# Patient Record
Sex: Male | Born: 1995 | Race: White | Hispanic: No | Marital: Single | Smoking: Never smoker
Health system: Southern US, Community
[De-identification: ages and names within clinical notes are randomized; demographics above are authoritative.]

## PROBLEM LIST (undated history)

## (undated) DIAGNOSIS — F32A Depression, unspecified: Secondary | ICD-10-CM

## (undated) DIAGNOSIS — F902 Attention-deficit hyperactivity disorder, combined type: Secondary | ICD-10-CM

## (undated) DIAGNOSIS — S060X0A Concussion without loss of consciousness, initial encounter: Secondary | ICD-10-CM

## (undated) DIAGNOSIS — F329 Major depressive disorder, single episode, unspecified: Secondary | ICD-10-CM

## (undated) DIAGNOSIS — F419 Anxiety disorder, unspecified: Secondary | ICD-10-CM

## (undated) HISTORY — DX: Major depressive disorder, single episode, unspecified: F32.9

## (undated) HISTORY — DX: Anxiety disorder, unspecified: F41.9

## (undated) HISTORY — DX: Depression, unspecified: F32.A

## (undated) HISTORY — DX: Attention-deficit hyperactivity disorder, combined type: F90.2

## (undated) HISTORY — DX: Concussion without loss of consciousness, initial encounter: S06.0X0A

---
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
## 2017-01-10 DIAGNOSIS — S060X0A Concussion without loss of consciousness, initial encounter: Secondary | ICD-10-CM

## 2017-01-10 HISTORY — DX: Concussion without loss of consciousness, initial encounter: S06.0X0A

## 2017-07-24 ENCOUNTER — Ambulatory Visit (INDEPENDENT_AMBULATORY_CARE_PROVIDER_SITE_OTHER): Payer: BLUE CROSS/BLUE SHIELD | Admitting: Physician Assistant

## 2017-07-24 ENCOUNTER — Ambulatory Visit (INDEPENDENT_AMBULATORY_CARE_PROVIDER_SITE_OTHER): Payer: BLUE CROSS/BLUE SHIELD

## 2017-07-24 ENCOUNTER — Encounter: Payer: Self-pay | Admitting: Physician Assistant

## 2017-07-24 VITALS — BP 114/69 | HR 64 | Temp 98.2°F | Ht 71.0 in | Wt 159.0 lb

## 2017-07-24 DIAGNOSIS — F902 Attention-deficit hyperactivity disorder, combined type: Secondary | ICD-10-CM | POA: Diagnosis not present

## 2017-07-24 DIAGNOSIS — Z7689 Persons encountering health services in other specified circumstances: Secondary | ICD-10-CM

## 2017-07-24 DIAGNOSIS — R052 Subacute cough: Secondary | ICD-10-CM

## 2017-07-24 DIAGNOSIS — F32A Depression, unspecified: Secondary | ICD-10-CM

## 2017-07-24 DIAGNOSIS — R05 Cough: Secondary | ICD-10-CM

## 2017-07-24 DIAGNOSIS — Z113 Encounter for screening for infections with a predominantly sexual mode of transmission: Secondary | ICD-10-CM | POA: Diagnosis not present

## 2017-07-24 DIAGNOSIS — Z23 Encounter for immunization: Secondary | ICD-10-CM | POA: Insufficient documentation

## 2017-07-24 DIAGNOSIS — F324 Major depressive disorder, single episode, in partial remission: Secondary | ICD-10-CM | POA: Diagnosis not present

## 2017-07-24 DIAGNOSIS — F329 Major depressive disorder, single episode, unspecified: Secondary | ICD-10-CM | POA: Diagnosis not present

## 2017-07-24 DIAGNOSIS — F419 Anxiety disorder, unspecified: Secondary | ICD-10-CM | POA: Diagnosis not present

## 2017-07-24 DIAGNOSIS — Z025 Encounter for examination for participation in sport: Secondary | ICD-10-CM

## 2017-07-24 DIAGNOSIS — R0989 Other specified symptoms and signs involving the circulatory and respiratory systems: Secondary | ICD-10-CM | POA: Diagnosis not present

## 2017-07-24 DIAGNOSIS — Z87820 Personal history of traumatic brain injury: Secondary | ICD-10-CM | POA: Diagnosis not present

## 2017-07-24 MED ORDER — AZITHROMYCIN 250 MG PO TABS: ORAL_TABLET | ORAL | 0 refills | Status: DC

## 2017-07-24 MED ORDER — PREDNISONE 50 MG PO TABS: ORAL_TABLET | ORAL | 0 refills | Status: DC

## 2017-07-24 NOTE — Patient Instructions (Addendum)
- Plan to go downstairs for Chest X-ray and labs for screen - Azithromycin and Prednisone for 5 days as directed  - Return if symptoms worsen or fail to improve  - Return in 2 months for 2nd HPV vaccine - Return in 6 months for 3rd HPV vaccine   Preventive Care 18-39 Years, Male Preventive care refers to lifestyle choices and visits with your health care provider that can promote health and wellness. What does preventive care include?  A yearly physical exam. This is also called an annual well check.  Dental exams once or twice a year.  Routine eye exams. Ask your health care provider how often you should have your eyes checked.  Personal lifestyle choices, including: ? Daily care of your teeth and gums. ? Regular physical activity. ? Eating a healthy diet. ? Avoiding tobacco and drug use. ? Limiting alcohol use. ? Practicing safe sex. What happens during an annual well check? The services and screenings done by your health care provider during your annual well check will depend on your age, overall health, lifestyle risk factors, and family history of disease. Counseling Your health care provider may ask you questions about your:  Alcohol use.  Tobacco use.  Drug use.  Emotional well-being.  Home and relationship well-being.  Sexual activity.  Eating habits.  Work and work Statistician.  Screening You may have the following tests or measurements:  Height, weight, and BMI.  Blood pressure.  Lipid and cholesterol levels. These may be checked every 5 years starting at age 23.  Diabetes screening. This is done by checking your blood sugar (glucose) after you have not eaten for a while (fasting).  Skin check.  Hepatitis C blood test.  Hepatitis B blood test.  Sexually transmitted disease (STD) testing.  Discuss your test results, treatment options, and if necessary, the need for more tests with your health care provider. Vaccines Your health care provider  may recommend certain vaccines, such as:  Influenza vaccine. This is recommended every year.  Tetanus, diphtheria, and acellular pertussis (Tdap, Td) vaccine. You may need a Td booster every 10 years.  Varicella vaccine. You may need this if you have not been vaccinated.  HPV vaccine. If you are 3 or younger, you may need three doses over 6 months.  Measles, mumps, and rubella (MMR) vaccine. You may need at least one dose of MMR.You may also need a second dose.  Pneumococcal 13-valent conjugate (PCV13) vaccine. You may need this if you have certain conditions and have not been vaccinated.  Pneumococcal polysaccharide (PPSV23) vaccine. You may need one or two doses if you smoke cigarettes or if you have certain conditions.  Meningococcal vaccine. One dose is recommended if you are age 20-21 years and a first-year college student living in a residence hall, or if you have one of several medical conditions. You may also need additional booster doses.  Hepatitis A vaccine. You may need this if you have certain conditions or if you travel or work in places where you may be exposed to hepatitis A.  Hepatitis B vaccine. You may need this if you have certain conditions or if you travel or work in places where you may be exposed to hepatitis B.  Haemophilus influenzae type b (Hib) vaccine. You may need this if you have certain risk factors.  Talk to your health care provider about which screenings and vaccines you need and how often you need them. This information is not intended to replace advice given  to you by your health care provider. Make sure you discuss any questions you have with your health care provider. Document Released: 01/22/2002 Document Revised: 08/15/2016 Document Reviewed: 09/27/2015 Elsevier Interactive Patient Education  2017 [employer].

## 2017-07-24 NOTE — Progress Notes (Signed)
Normal chest x-ray Complete antibiotic and steroid as prescribed

## 2017-07-24 NOTE — Progress Notes (Signed)
Subjective:     Jimmy Benjamin is a 21 y.o. male who presents for a school sports physical exam. Patient/parent deny any current health related concerns.  He plans to participate in Reserve at [employer]  Patient reports history of 3 concussions; 2 in high school and most recent in February 2018. All concussions were without loss of consciousness. Most recent concussion took 4 weeks to recover and patient missed 1 month of college.  Depression/Anxiety: taking Fluoxetine without difficulty. Feels medication is working well. Denies adverse effects. Endorses some anhedonia and depressed mood on some days. Reports restful sleep. Denies symptoms of mania/hypomania. Denies suicidal thinking. Denies auditory/visual hallucinations.   Immunization History  Administered Date(s) Administered  . Meningococcal Conjugate 01/11/2011  . Td 06/15/2009    The following portions of the patient's history were reviewed and updated as appropriate: allergies, current medications, past family history, past medical history, past social history, past surgical history and problem list.  Past Medical History:  Diagnosis Date  . ADHD (attention deficit hyperactivity disorder), combined type   . Anxiety   . Concussion without loss of consciousness 01/2017  . Depression    History reviewed. No pertinent surgical history.  Family History  Problem Relation Age of Onset  . Hyperlipidemia Maternal Grandfather   . Lung cancer Maternal Grandfather   . Brain cancer Paternal Grandmother   . Depression Mother   . Lymphoma Sister    Review of Systems Constitutional: negative Eyes: negative Ears, nose, mouth, throat, and face: negative Respiratory: negative except for cough. Cardiovascular: negative Gastrointestinal: negative Genitourinary:negative Hematologic/lymphatic: negative Musculoskeletal:negative Neurological: negative Behavioral/Psych: negative except for ADHD and  depression. Allergic/Immunologic: negative    Objective:   Vitals:   07/24/17 1424  BP: 114/69  Pulse: 64  Temp: 98.2 F (36.8 C)  SpO2: 98%    General Appearance:  Alert, cooperative, no distress, appropriate for age                            Head:  Normocephalic, without obvious abnormality                             Eyes:  PERRL, EOM's intact, conjunctiva and cornea clear,                              Ears:  TM pearly gray color and semitransparent, external ear canals normal, both ears                            Nose:  Nares symmetrical                          Throat:  Lips, tongue, and mucosa are moist, pink, and intact; good dentition                             Neck:  Supple; symmetrical, trachea midline, no adenopathy; thyroid: no enlargement, symmetric, no tenderness/mass/nodules                             Back:  Symmetrical, no curvature, ROM normal  Lungs:  respirations unlabored, diffuse rhonchi in the lower lobes bilaterally                            Heart:  regular rate & normal rhythm, S1 and S2 normal, no murmurs, rubs, or gallops                     Abdomen:  Soft, non-tender, no mass or organomegaly              Genitourinary:  deferred         Musculoskeletal:  Tone and strength strong and symmetrical, all extremities; no joint pain or edema, normal duck walk                                       Lymphatic:  No adenopathy             Skin/Hair/Nails:  Skin warm, dry and intact, no rashes or abnormal dyspigmentation                   Neurologic:  Alert and oriented x3, no cranial nerve deficits, DTR's intact, sensation grossly intact, normal gait and station, no tremor Psych: well-groomed, cooperative, good eye contact, depressed mood, affect mood-congruent, speech fluent, organized thought processes  Depression screen [facility] 2/9 07/24/2017  Decreased Interest 2  Down, Depressed, Hopeless 1  PHQ - 2 Score 3  Altered sleeping 0  Tired,  decreased energy 0  Change in appetite 0  Feeling bad or failure about yourself  0  Trouble concentrating 1  Moving slowly or fidgety/restless 0  Suicidal thoughts 0  PHQ-9 Score 4     Assessment:    Satisfactory school sports physical exam.    History of multiple concussions. Evaluated by Sports Medicine, Dr. Clementeen Graham today. Major depressive disorder in partial remission ADHD, combined type Subacute cough Routine STI screening   Plan:    Permission granted to participate in athletics without restrictions. Form signed and returned to patient. Immunizations reviewed and updated per orders below  1. Encounter to establish care  2. Encounter for sports participation examination  3. Attention deficit hyperactivity disorder (ADHD), combined type - cont Mydayis 25mg  daily  4. History of multiple concussions - eval by Sports Medicine - explained patient is more susceptible to concussion given history of 3 closed head injuries - discussed that repeat concussion may warrant avoidance of contact sports going forward  5. Rhonchi - DG Chest 2 View  6. Subacute cough - DG Chest 2 View - azithromycin (ZITHROMAX Z-PAK) 250 MG tablet; Take 2 tablets (500 mg) on  Day 1,  followed by 1 tablet (250 mg) once daily on Days 2 through 5.  Dispense: 6 tablet; Refill: 0 - predniSONE (DELTASONE) 50 MG tablet; One tab PO daily for 5 days.  Dispense: 5 tablet; Refill: 0  7. Routine screening for STI (sexually transmitted infection) - GC/Chlamydia Probe Amp - HIV antibody - RPR - Hepatitis C antibody  8. Need for HPV vaccination - HPV 9-valent vaccine,Recombinat  9. Need for meningococcal vaccination - MENINGOCOCCAL MCV4O(MENVEO)  10. Major depressive disorder with single episode, in partial remission (HCC) - PHQ9 score 4, mild. Continue Prozac daily - FLUoxetine (PROZAC) 40 MG capsule; Take 1 capsule by mouth daily.; Refill: 3  11. Anxiety and depression  Patient education and  anticipatory  guidance given Patient agrees with treatment plan Follow-up in 2 months for 2nd HPV injection or sooner as needed  Levonne Hubertharley E. Jshon Ibe PA-C

## 2017-07-25 LAB — HIV ANTIBODY (ROUTINE TESTING W REFLEX): HIV: NONREACTIVE

## 2017-07-25 LAB — GC/CHLAMYDIA PROBE AMP
CT Probe RNA: NOT DETECTED
GC PROBE AMP APTIMA: NOT DETECTED

## 2017-07-25 LAB — HEPATITIS C ANTIBODY: HCV Ab: NONREACTIVE

## 2017-07-25 LAB — RPR

## 2017-07-25 NOTE — Progress Notes (Signed)
STI testing negative

## 2017-07-29 ENCOUNTER — Encounter: Payer: Self-pay | Admitting: Physician Assistant

## 2017-07-29 DIAGNOSIS — F32A Depression, unspecified: Secondary | ICD-10-CM | POA: Insufficient documentation

## 2017-07-29 DIAGNOSIS — F329 Major depressive disorder, single episode, unspecified: Secondary | ICD-10-CM | POA: Insufficient documentation

## 2017-07-29 DIAGNOSIS — F419 Anxiety disorder, unspecified: Secondary | ICD-10-CM

## 2017-07-29 DIAGNOSIS — F324 Major depressive disorder, single episode, in partial remission: Secondary | ICD-10-CM | POA: Insufficient documentation

## 2017-07-31 ENCOUNTER — Encounter: Payer: Self-pay | Admitting: Family Medicine

## 2017-07-31 NOTE — Progress Notes (Signed)
Jimmy Benjamin seen briefly on August 15 with Carlis Stable, PA-C as part of a preparticipation physical exam for college lacrosse. He has a history of concussions but is currently asymptomatic. We had a lengthy discussion about what to look out for for concussion. Patient will return as needed. I think is perfectly fine for him to play lacrosse this year.

## 2017-09-23 ENCOUNTER — Ambulatory Visit: Payer: BLUE CROSS/BLUE SHIELD

## 2018-01-22 ENCOUNTER — Ambulatory Visit: Payer: BLUE CROSS/BLUE SHIELD

## 2018-08-11 ENCOUNTER — Other Ambulatory Visit: Payer: Self-pay

## 2018-08-11 ENCOUNTER — Emergency Department (HOSPITAL_COMMUNITY)
Admission: EM | Admit: 2018-08-11 | Discharge: 2018-08-11 | Disposition: A | Discharge status: Home / Self Care | Payer: BLUE CROSS/BLUE SHIELD | Attending: Emergency Medicine | Admitting: Emergency Medicine

## 2018-08-11 ENCOUNTER — Encounter (HOSPITAL_COMMUNITY): Payer: Self-pay | Admitting: Emergency Medicine

## 2018-08-11 DIAGNOSIS — B370 Candidal stomatitis: Secondary | ICD-10-CM

## 2018-08-11 DIAGNOSIS — Z79899 Other long term (current) drug therapy: Secondary | ICD-10-CM | POA: Diagnosis not present

## 2018-08-11 DIAGNOSIS — K0889 Other specified disorders of teeth and supporting structures: Secondary | ICD-10-CM | POA: Diagnosis present

## 2018-08-11 DIAGNOSIS — K121 Other forms of stomatitis: Secondary | ICD-10-CM | POA: Diagnosis not present

## 2018-08-11 LAB — CBC WITH DIFFERENTIAL/PLATELET
Abs Immature Granulocytes: 0 10*3/uL (ref 0.0–0.1)
BASOS PCT: 0 %
Basophils Absolute: 0 10*3/uL (ref 0.0–0.1)
EOS ABS: 0 10*3/uL (ref 0.0–0.7)
EOS PCT: 1 %
HCT: 45.7 % (ref 39.0–52.0)
Hemoglobin: 15.7 g/dL (ref 13.0–17.0)
IMMATURE GRANULOCYTES: 0 %
Lymphocytes Relative: 24 %
Lymphs Abs: 1.5 10*3/uL (ref 0.7–4.0)
MCH: 31.5 pg (ref 26.0–34.0)
MCHC: 34.4 g/dL (ref 30.0–36.0)
MCV: 91.6 fL (ref 78.0–100.0)
MONOS PCT: 11 %
Monocytes Absolute: 0.7 10*3/uL (ref 0.1–1.0)
NEUTROS PCT: 64 %
Neutro Abs: 4.2 10*3/uL (ref 1.7–7.7)
PLATELETS: 268 10*3/uL (ref 150–400)
RBC: 4.99 MIL/uL (ref 4.22–5.81)
RDW: 11.4 % — AB (ref 11.5–15.5)
WBC: 6.5 10*3/uL (ref 4.0–10.5)

## 2018-08-11 LAB — BASIC METABOLIC PANEL
ANION GAP: 12 (ref 5–15)
BUN: 11 mg/dL (ref 6–20)
CALCIUM: 9.1 mg/dL (ref 8.9–10.3)
CO2: 29 mmol/L (ref 22–32)
CREATININE: 1.01 mg/dL (ref 0.61–1.24)
Chloride: 96 mmol/L — ABNORMAL LOW (ref 98–111)
GFR calc Af Amer: >60 mL/min (ref >60)
GLUCOSE: 91 mg/dL (ref 70–99)
Potassium: 3.9 mmol/L (ref 3.5–5.1)
Sodium: 137 mmol/L (ref 135–145)

## 2018-08-11 MED ORDER — LIDOCAINE VISCOUS HCL 2 % MT SOLN
15.0000 mL | Freq: Once | OROMUCOSAL | Status: AC
  Administered 2018-08-11: 15 mL via OROMUCOSAL
  Filled 2018-08-11: qty 15

## 2018-08-11 MED ORDER — MAGIC MOUTHWASH W/LIDOCAINE: 10.0000 mL | Freq: Four times a day (QID) | ORAL | 0 refills | Status: DC

## 2018-08-11 NOTE — ED Provider Notes (Signed)
[facility] EMERGENCY DEPARTMENT Provider Note   CSN: [id number] Arrival date & time: 08/11/18  2146     History   Chief Complaint Chief Complaint  Patient presents with  . Oral Thrush    HPI Jimmy Benjamin is a 22 y.o. male.  Who presents the emergency department chief complaint of mouth infection.  The patient was seen at an urgent care 2 days ago and told that he might have Stevens-Johnson syndrome.  Had his wisdom teeth removed and is was on prophylactic penicillin VK 4 times daily but finished his course about 2 weeks ago.  This past Thursday, 4 days ago the patient states that he felt like he was getting sick with sore throat, body aches chills and ran a fever.  He took DayQuil and NyQuil and started taking his penicillin again.  Did ask that he awoke with an aphthous ulcer on his palate and tongue which progressed to his gingiva and cheeks and throat.  He continue taking the penicillin and began developing a thick white coat over his tongue.  He complains of severe pain in his mouth and gums.  He was not given any medications at discharge from the urgent care.  He denies fevers or chills.  He has had difficulty eating and drinking secondary to the pain as well.  He is no longer running fevers.  HPI  Past Medical History:  Diagnosis Date  . ADHD (attention deficit hyperactivity disorder), combined type   . Anxiety   . Concussion without loss of consciousness 01/2017   x 3   . Depression     Patient Active Problem List   Diagnosis Date Noted  . Major depressive disorder with single episode, in partial remission (HCC) 07/29/2017  . Anxiety and depression 07/29/2017  . Subacute cough 07/24/2017  . Rhonchi 07/24/2017  . History of multiple concussions 07/24/2017  . Attention deficit hyperactivity disorder (ADHD), combined type 07/24/2017  . Need for HPV vaccination 07/24/2017  . Atypical chest pain 10/14/2016  . Right wrist injury 10/26/2014    History  reviewed. No pertinent surgical history.      Home Medications    Prior to Admission medications   Medication Sig Start Date End Date Taking? Authorizing Provider  azithromycin (ZITHROMAX Z-PAK) 250 MG tablet Take 2 tablets (500 mg) on  Day 1,  followed by 1 tablet (250 mg) once daily on Days 2 through 5. 07/24/17   Carlis Stable, PA-C  FLUoxetine (PROZAC) 40 MG capsule Take 1 capsule by mouth daily. 06/09/17   [provider]  MYDAYIS 25 MG CP24 Take 1 capsule by mouth daily. 06/29/17   [provider]  predniSONE (DELTASONE) 50 MG tablet One tab PO daily for 5 days. 07/24/17   Carlis Stable, PA-C    Family History Family History  Problem Relation Age of Onset  . Hyperlipidemia Maternal Grandfather   . Lung cancer Maternal Grandfather   . Brain cancer Paternal Grandmother   . Depression Mother   . Lymphoma Sister     Social History Social History   Tobacco Use  . Smoking status: Never Smoker  . Smokeless tobacco: Never Used  Substance Use Topics  . Alcohol use: Yes    Alcohol/week: 0.0 standard drinks    Comment: socially at college  . Drug use: No     Allergies   Patient has no known allergies.   Review of Systems Review of Systems  Ten systems reviewed and are negative for  acute change, except as noted in the HPI.   Physical Exam Updated Vital Signs Pulse 77   Temp 98.9 F (37.2 C) (Axillary)   Resp 16   Ht 5\' 11"  (1.803 m)   Wt 68.9 kg   SpO2 100%   BMI 21.20 kg/m   Physical Exam  Constitutional: He appears well-developed and well-nourished. No distress.  HENT:  Head: Normocephalic and atraumatic.  Mouth/Throat: Oral lesions present. Oropharyngeal exudate present.    Eyes: Conjunctivae are normal. No scleral icterus.  Neck: Normal range of motion. Neck supple.  Cardiovascular: Normal rate, regular rhythm and normal heart sounds.  Pulmonary/Chest: Effort normal and breath sounds normal. No respiratory  distress.  Abdominal: Soft. There is no tenderness.  Musculoskeletal: He exhibits no edema.  Neurological: He is alert.  Skin: Skin is warm and dry. He is not diaphoretic.  Psychiatric: His behavior is normal.  Nursing note and vitals reviewed.    ED Treatments / Results  Labs (all labs ordered are listed, but only abnormal results are displayed) Labs Reviewed  CBC WITH DIFFERENTIAL/PLATELET - Abnormal; Notable for the following components:      Result Value   RDW 11.4 (*)    All other components within normal limits  BASIC METABOLIC PANEL    EKG None  Radiology No results found.  Procedures Procedures (including critical care time)  Medications Ordered in ED Medications - No data to display   Initial Impression / Assessment and Plan / ED Course  I have reviewed the triage vital signs and the nursing notes.  Pertinent labs & imaging results that were available during my care of the patient were reviewed by me and considered in my medical decision making (see chart for details).     Patient with oral and gingival stomatitis.  I believe he presents a mixed picture of likely viral stomatitis and a secondary fungal thrush infection after continuing penicillin.  Patient was given viscous lidocaine here in the emergency department with immediate and total relief of his mouth pain she was quite grateful for.  The patient will be discharged with allergic mouthwash with nystatin and lidocaine.  I discussed the case with Dr. Gwenlyn Fudge who agrees with assessment and plan.  He is advised to follow-up with his primary care very closely in the next 2 days for recheck.  The patient does not have any involvement of any of the other mucosal surfaces are generalized he also has no surface rashes suggestive of other autoimmune process like Behcet's syndrome or Stevens-Johnson syndrome.  Final Clinical Impressions(s) / ED Diagnoses   Final diagnoses:  None    ED Discharge Orders    None        Arthor Captain, PA-C 08/12/18 0112    Pricilla Loveless, MD 08/15/18 2234

## 2018-08-11 NOTE — ED Notes (Signed)
ED Provider at bedside. 

## 2018-08-11 NOTE — ED Triage Notes (Signed)
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

## 2018-08-11 NOTE — Discharge Instructions (Signed)
Contact a health care provider if: Your symptoms get worse. You develop new symptoms, especially: A rash. New symptoms that do not involve your mouth area. Your symptoms last longer than three weeks. Your stomatitis goes away and then returns. You have a harder time eating and drinking normally. You have increasing fatigue or weakness. You lose your appetite or you feel nauseous. You have a fever.

## 2019-02-20 IMAGING — DX DG CHEST 2V
2 series · 2 of 2 positions shown · non-contrast
Comparison: None.

CLINICAL DATA: Cough for 2 months.

EXAM:
CHEST  2 VIEW

[chest pa]
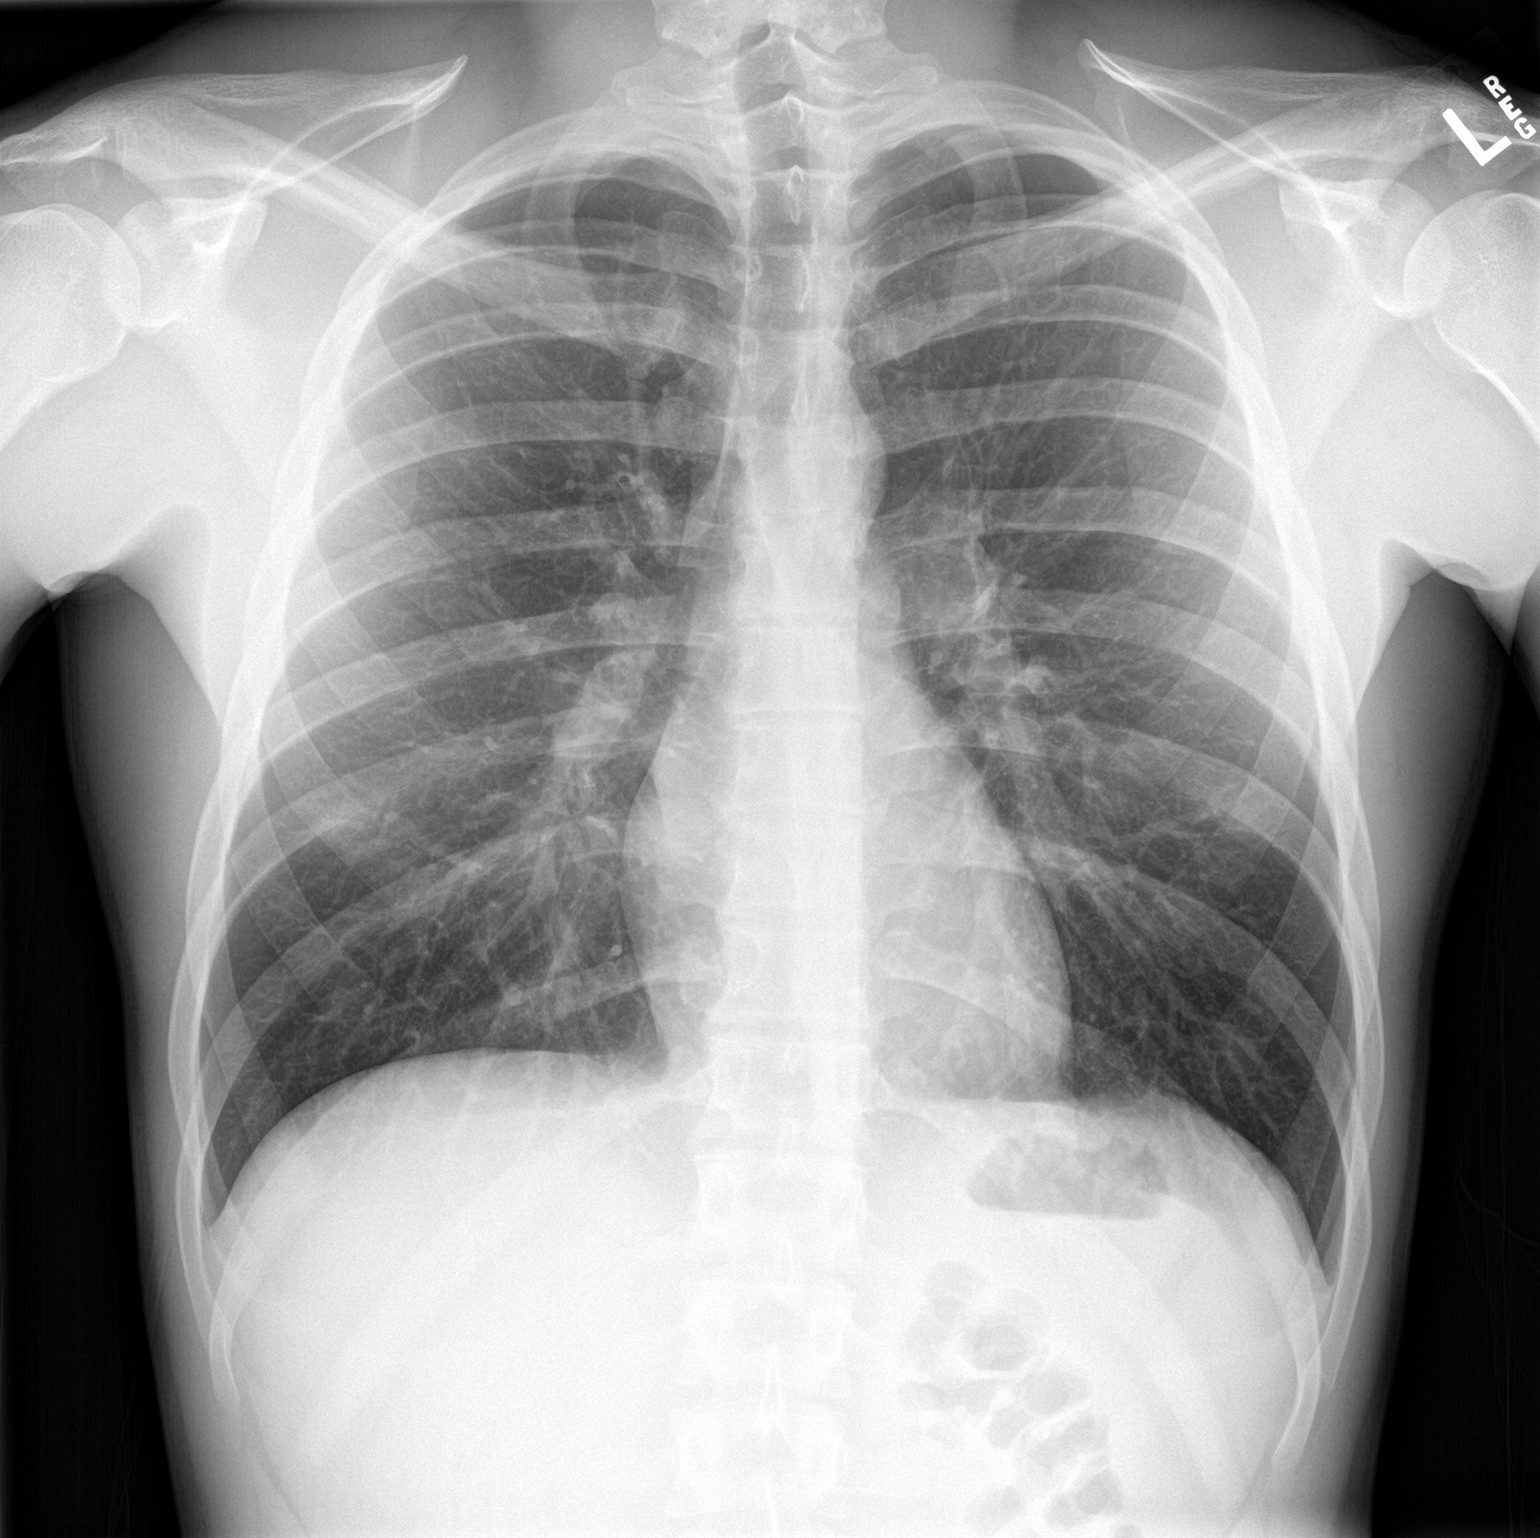

[chest lat]
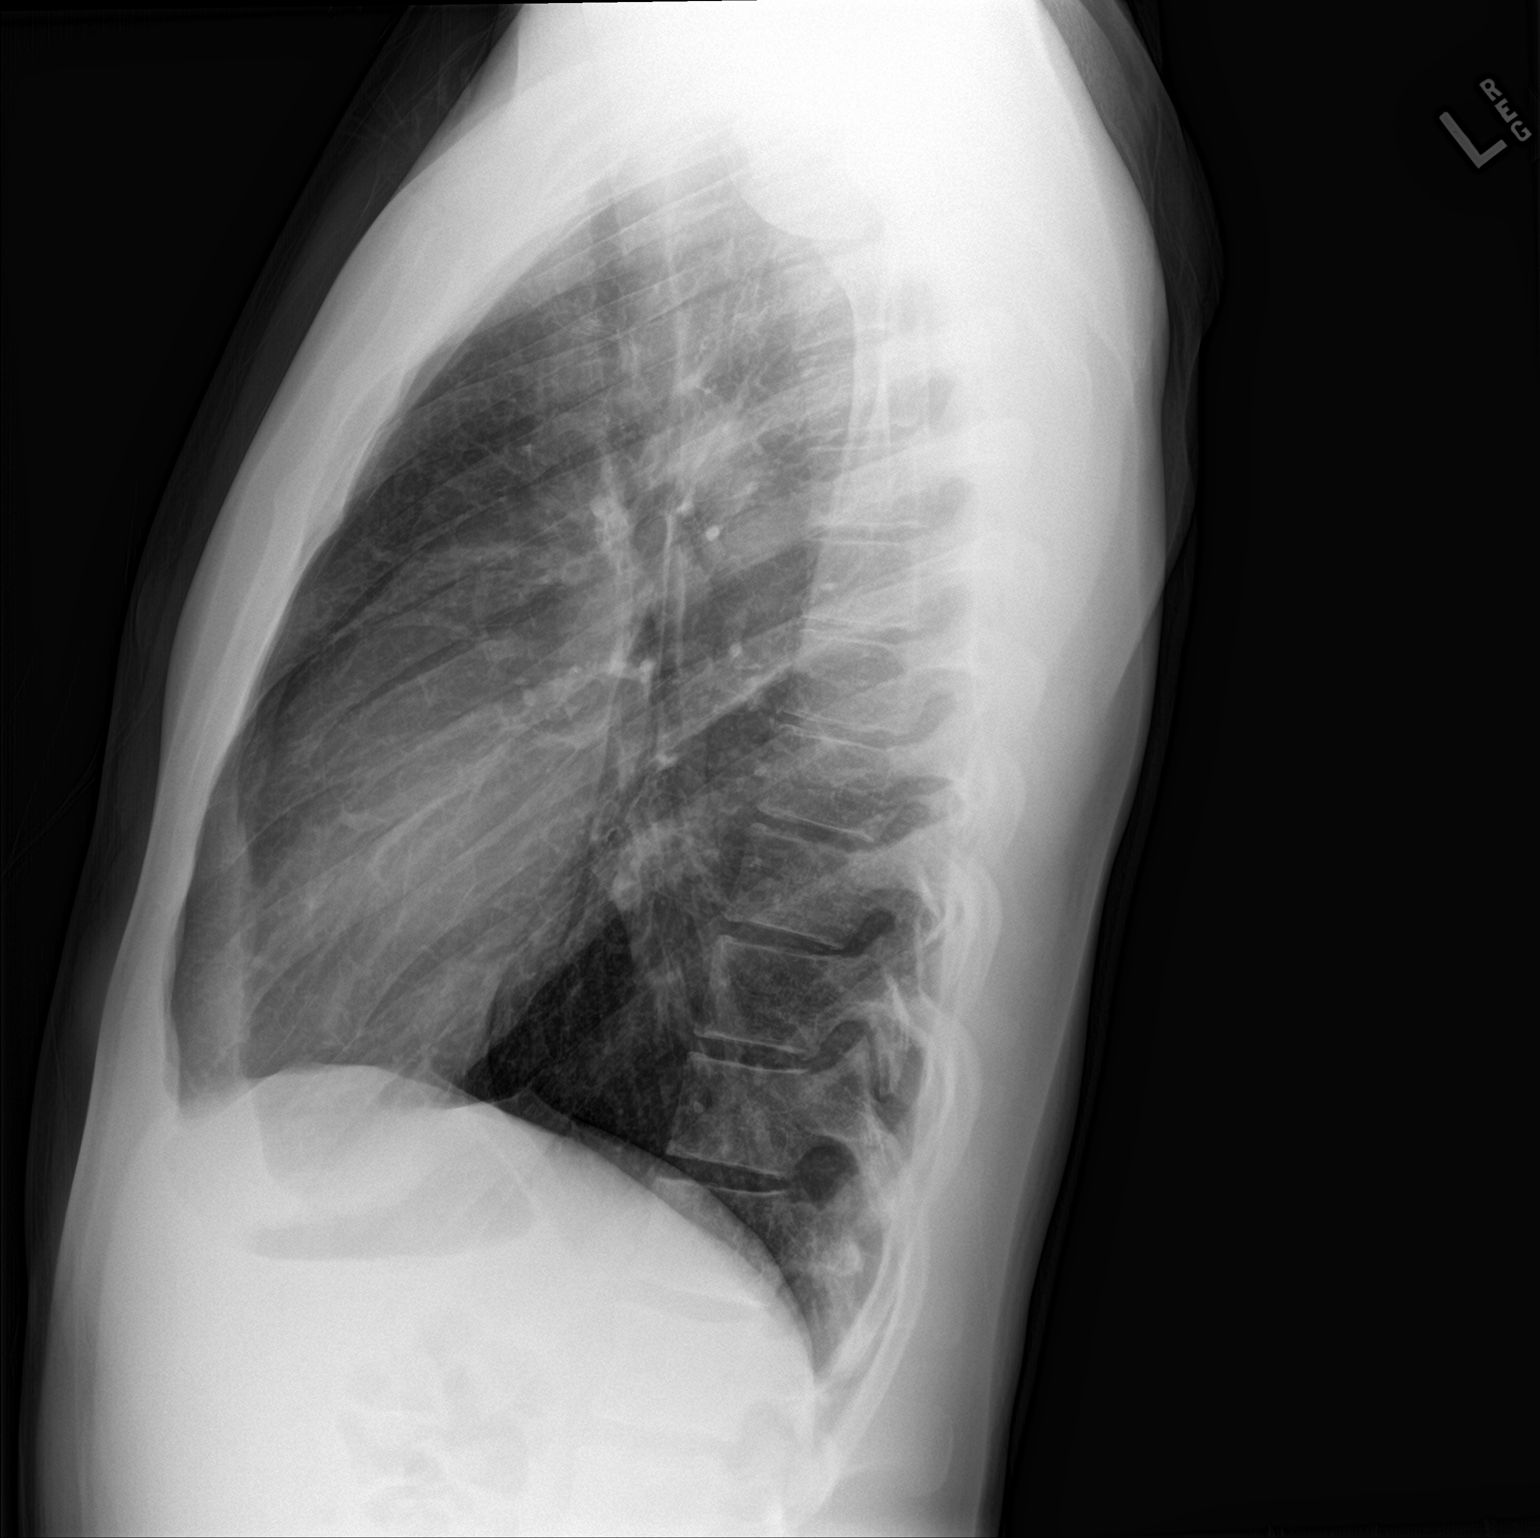

[2 of 2 positions shown; findings below may reference images not displayed]

FINDINGS: The lungs are clear. Heart size is normal. No pneumothorax or
pleural fluid. No bony abnormality.
IMPRESSION: Normal chest.

## 2021-08-24 ENCOUNTER — Telehealth: Payer: Self-pay

## 2021-08-24 NOTE — Telephone Encounter (Signed)
Pt called and said that his dad is a patient of Dr Beverely Low and you agreed to take his son on as a patient? The dads name is Jimmy Benjamin and the son name is Jimmy Benjamin can we make a new patient appt for this patient?

## 2021-08-24 NOTE — Telephone Encounter (Signed)
Ok to schedule patient

## 2021-11-10 ENCOUNTER — Ambulatory Visit: Payer: BLUE CROSS/BLUE SHIELD | Admitting: Family Medicine

## 2022-01-18 ENCOUNTER — Encounter: Payer: Self-pay | Admitting: Family Medicine

## 2022-01-18 ENCOUNTER — Ambulatory Visit (INDEPENDENT_AMBULATORY_CARE_PROVIDER_SITE_OTHER): Payer: BC Managed Care – PPO | Admitting: Family Medicine

## 2022-01-18 VITALS — BP 120/78 | HR 70 | Temp 98.7°F | Resp 16 | Ht 71.0 in | Wt 171.6 lb

## 2022-01-18 DIAGNOSIS — K580 Irritable bowel syndrome with diarrhea: Secondary | ICD-10-CM | POA: Diagnosis not present

## 2022-01-18 MED ORDER — DICYCLOMINE HCL 20 MG PO TABS: 20.0000 mg | ORAL_TABLET | Freq: Three times a day (TID) | ORAL | 3 refills | Status: DC | PRN

## 2022-01-18 MED ORDER — VIBERZI 100 MG PO TABS: 100.0000 mg | ORAL_TABLET | Freq: Two times a day (BID) | ORAL | 3 refills | Status: DC

## 2022-01-18 NOTE — Patient Instructions (Addendum)
Follow up in 4-6 weeks to recheck IBS symptoms START the Viberzi twice daily as directed USE the dicyclomine only as needed for abdominal spasm/pain Make sure to drink plenty of water IF the Viberzi causes severe constipation, let me know so we can lower the dose Check out [email or web address] for a coupon card depending on how much it costs Call with any questions or concerns Hang in there! Welcome Back!!!

## 2022-01-18 NOTE — Progress Notes (Signed)
° °  Subjective:    Patient ID: Jimmy Benjamin, male    DOB: 1996-03-08, 26 y.o.   MRN: [id number]  HPI New to establish.  Previous PCP- Doran Heater  Stomach issues- pt reports sxs started in high school.  'sharp pains, frequent bathroom visits'.  Pt feels he has IBS as a lot of his sxs are stress related.  Pt was let go from his job b/c he 'used the bathroom too much'  Pains are intermittent but he feels pain is 'pretty bad at least once a week'.  Currently using the bathroom 4-5x/day.  Stools are loose.  Denies constipation.  Does not see blood in stool.  Pt has used Tums, Gas-X, Imodium.   Review of Systems For ROS see HPI   This visit occurred during the SARS-CoV-2 public health emergency.  Safety protocols were in place, including screening questions prior to the visit, additional usage of staff PPE, and extensive cleaning of exam room while observing appropriate contact time as indicated for disinfecting solutions.      Objective:   Physical Exam Vitals reviewed.  Constitutional:      General: He is not in acute distress.    Appearance: He is well-developed. He is not ill-appearing.  HENT:     Head: Normocephalic and atraumatic.  Eyes:     Extraocular Movements: Extraocular movements intact.     Conjunctiva/sclera: Conjunctivae normal.     Pupils: Pupils are equal, round, and reactive to light.  Neck:     Thyroid: No thyromegaly.  Cardiovascular:     Rate and Rhythm: Normal rate and regular rhythm.     Pulses: Normal pulses.     Heart sounds: Normal heart sounds. No murmur heard. Pulmonary:     Effort: Pulmonary effort is normal. No respiratory distress.     Breath sounds: Normal breath sounds.  Abdominal:     General: Bowel sounds are normal. There is no distension.     Palpations: Abdomen is soft.     Tenderness: There is no abdominal tenderness. There is no guarding or rebound.  Musculoskeletal:     Cervical back: Normal range of motion and neck supple.      Right lower leg: No edema.     Left lower leg: No edema.  Lymphadenopathy:     Cervical: No cervical adenopathy.  Skin:    General: Skin is warm and dry.  Neurological:     General: No focal deficit present.     Mental Status: He is alert and oriented to person, place, and time.     Cranial Nerves: No cranial nerve deficit.  Psychiatric:        Mood and Affect: Mood normal.        Behavior: Behavior normal.          Assessment & Plan:

## 2022-01-22 DIAGNOSIS — K580 Irritable bowel syndrome with diarrhea: Secondary | ICD-10-CM | POA: Insufficient documentation

## 2022-01-22 NOTE — Assessment & Plan Note (Signed)
New.  Pt reports this is an ongoing issue and has been problematic since HS.  He was actually fired from his job due to using the bathroom too frequently.  Admits most of his sxs are stress related.  He is working on Optician, dispensing.  Currently having loose stools 4-5x/day and has associated abd pain.  Reviewed dx and possible tx options.  Will start Viberzi to help slow the stools and add Dicyclomine as needed for abd pain and spasm.  Reviewed supportive care and red flags that should prompt return.  Pt expressed understanding and is in agreement w/ plan.

## 2022-02-13 DIAGNOSIS — Z79899 Other long term (current) drug therapy: Secondary | ICD-10-CM | POA: Diagnosis not present

## 2022-02-13 DIAGNOSIS — F902 Attention-deficit hyperactivity disorder, combined type: Secondary | ICD-10-CM | POA: Diagnosis not present

## 2022-02-19 DIAGNOSIS — F902 Attention-deficit hyperactivity disorder, combined type: Secondary | ICD-10-CM | POA: Diagnosis not present

## 2022-02-28 ENCOUNTER — Ambulatory Visit: Payer: BC Managed Care – PPO | Admitting: Family Medicine

## 2022-05-15 DIAGNOSIS — Z79899 Other long term (current) drug therapy: Secondary | ICD-10-CM | POA: Diagnosis not present

## 2022-05-15 DIAGNOSIS — F902 Attention-deficit hyperactivity disorder, combined type: Secondary | ICD-10-CM | POA: Diagnosis not present

## 2022-11-22 DIAGNOSIS — Z79899 Other long term (current) drug therapy: Secondary | ICD-10-CM | POA: Diagnosis not present

## 2022-11-22 DIAGNOSIS — F902 Attention-deficit hyperactivity disorder, combined type: Secondary | ICD-10-CM | POA: Diagnosis not present

## 2022-11-23 DIAGNOSIS — F902 Attention-deficit hyperactivity disorder, combined type: Secondary | ICD-10-CM | POA: Diagnosis not present

## 2023-04-01 ENCOUNTER — Ambulatory Visit (INDEPENDENT_AMBULATORY_CARE_PROVIDER_SITE_OTHER): Payer: BC Managed Care – PPO | Admitting: Family Medicine

## 2023-04-01 ENCOUNTER — Encounter: Payer: Self-pay | Admitting: Family Medicine

## 2023-04-01 ENCOUNTER — Ambulatory Visit (HOSPITAL_BASED_OUTPATIENT_CLINIC_OR_DEPARTMENT_OTHER)
Admission: RE | Admit: 2023-04-01 | Discharge: 2023-04-01 | Disposition: A | Discharge status: Home / Self Care | Payer: BC Managed Care – PPO | Source: Ambulatory Visit | Attending: Family Medicine | Admitting: Family Medicine

## 2023-04-01 ENCOUNTER — Other Ambulatory Visit: Payer: Self-pay

## 2023-04-01 VITALS — BP 108/64 | Ht 71.0 in | Wt 171.0 lb

## 2023-04-01 DIAGNOSIS — M25561 Pain in right knee: Secondary | ICD-10-CM

## 2023-04-01 DIAGNOSIS — M47817 Spondylosis without myelopathy or radiculopathy, lumbosacral region: Secondary | ICD-10-CM | POA: Insufficient documentation

## 2023-04-01 DIAGNOSIS — M7551 Bursitis of right shoulder: Secondary | ICD-10-CM | POA: Insufficient documentation

## 2023-04-01 DIAGNOSIS — M25861 Other specified joint disorders, right knee: Secondary | ICD-10-CM | POA: Insufficient documentation

## 2023-04-01 DIAGNOSIS — M545 Low back pain, unspecified: Secondary | ICD-10-CM | POA: Diagnosis not present

## 2023-04-01 NOTE — Assessment & Plan Note (Signed)
Acute on chronic in nature.  He has had pain for several years.  No radicular pain at this point. -Counseled on home exercise therapy and supportive care. - xray  - referral to PT.  - could consider further imaging.

## 2023-04-01 NOTE — Assessment & Plan Note (Signed)
Acutely occurring. Does have changes around the subscapularis and supraspinatus with impingement on dynamic testing.  -Counseled on home exercise therapy and supportive care. -Referral to physical therapy. -Could consider injection.

## 2023-04-01 NOTE — Progress Notes (Signed)
  Jimmy Benjamin - 27 y.o. male MRN [id number]  Date of birth: [birth year]  SUBJECTIVE:  Including CC & ROS.  No chief complaint on file.   Jimmy Benjamin is a 27 y.o. male that is presenting with acute on chronic low back pain and acute right shoulder and knee pain.  He played lacrosse for several years.  He notices these pains when he is performing certain maneuvers such as lifting or going up and down stairs.  Back pain seems to be worse in the morning.  Denies any history of surgery.   Review of Systems See HPI   HISTORY: Past Medical, Surgical, Social, and Family History Reviewed & Updated per EMR.   Pertinent Historical Findings include:  Past Medical History:  Diagnosis Date   ADHD (attention deficit hyperactivity disorder), combined type    Anxiety    Concussion without loss of consciousness 01/2017   x 3    Depression     History reviewed. No pertinent surgical history.   PHYSICAL EXAM:  VS: BP 108/64 (BP Location: Left Arm, Patient Position: Sitting)   Ht  (1.803 m)   Wt 171 lb (77.6 kg)   BMI 23.85 kg/m  Physical Exam Gen: NAD, alert, cooperative with exam, well-appearing MSK:  Neurovascularly intact    Limited ultrasound: Right knee pain, right shoulder pain:  Right knee: Trace effusion. Normal-appearing quadricep and patellar tendon. No significant changes in the medial or lateral joint space. Trace Baker's cyst appreciated.  Right shoulder: Degenerative changes of the proximal portion of the biceps tendon. Mild bursitis appreciated of the subscapularis and supraspinatus. No significant changes in the posterior glenohumeral joint  Summary: Findings consistent with bursitis of the shoulder and trace Baker's cyst  Ultrasound and interpretation by Clare Gandy, MD    ASSESSMENT & PLAN:   Facet arthropathy, lumbosacral Acute on chronic in nature.  He has had pain for several years.  No radicular pain at this point. -Counseled on home  exercise therapy and supportive care. - xray  - referral to PT.  - could consider further imaging.   Impingement of knee joint, right Acutely occurring. Has more pain in the posterior aspect where the Baker's cyst is occurring.  - counseled on home exercise therapy and supportive care - referral to PT  - could consider further imaging.   Subacromial bursitis of right shoulder joint Acutely occurring. Does have changes around the subscapularis and supraspinatus with impingement on dynamic testing.  -Counseled on home exercise therapy and supportive care. -Referral to physical therapy. -Could consider injection.

## 2023-04-01 NOTE — Patient Instructions (Signed)
Nice to meet you Please try ice on the shoulder and knee  Please try heat on the back  We'll call with the xray results.  We have made a referral to physical therapy   Please send me a message in MyChart with any questions or updates.  Please see me back in 4 weeks.   --Dr. Jordan Likes

## 2023-04-01 NOTE — Assessment & Plan Note (Signed)
Acutely occurring. Has more pain in the posterior aspect where the Baker's cyst is occurring.  - counseled on home exercise therapy and supportive care - referral to PT  - could consider further imaging.

## 2023-04-05 ENCOUNTER — Telehealth: Payer: Self-pay | Admitting: Family Medicine

## 2023-04-05 NOTE — Telephone Encounter (Signed)
Left VM for patient. If he calls back please have him speak with a nurse/CMA and inform that his xray is showing one additional lumbar bone. This is a normal variant. The xrays do not show any arthritis.   If any questions then please take the best time and phone number to call and I will try to call him back.   Myra Rude, MD [facility] 04/05/2023, 8:32 AM

## 2023-04-10 DIAGNOSIS — M545 Low back pain, unspecified: Secondary | ICD-10-CM | POA: Diagnosis not present

## 2023-04-10 DIAGNOSIS — M25519 Pain in unspecified shoulder: Secondary | ICD-10-CM | POA: Diagnosis not present

## 2023-04-29 ENCOUNTER — Ambulatory Visit: Payer: Self-pay | Admitting: Family Medicine

## 2023-05-12 DIAGNOSIS — M25519 Pain in unspecified shoulder: Secondary | ICD-10-CM | POA: Diagnosis not present

## 2023-05-12 DIAGNOSIS — M545 Low back pain, unspecified: Secondary | ICD-10-CM | POA: Diagnosis not present

## 2023-09-11 DIAGNOSIS — Z79899 Other long term (current) drug therapy: Secondary | ICD-10-CM | POA: Diagnosis not present

## 2023-09-11 DIAGNOSIS — F902 Attention-deficit hyperactivity disorder, combined type: Secondary | ICD-10-CM | POA: Diagnosis not present

## 2023-09-12 DIAGNOSIS — F902 Attention-deficit hyperactivity disorder, combined type: Secondary | ICD-10-CM | POA: Diagnosis not present

## 2023-09-27 ENCOUNTER — Ambulatory Visit: Payer: BC Managed Care – PPO | Admitting: Professional

## 2023-09-27 ENCOUNTER — Encounter: Payer: Self-pay | Admitting: Professional

## 2023-09-27 DIAGNOSIS — F902 Attention-deficit hyperactivity disorder, combined type: Secondary | ICD-10-CM

## 2023-09-27 DIAGNOSIS — F324 Major depressive disorder, single episode, in partial remission: Secondary | ICD-10-CM

## 2023-09-27 NOTE — Progress Notes (Signed)
° ° ° ° ° ° ° ° ° ° ° ° ° ° °  Araya Roel, LCMHC °
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
## 2023-10-10 ENCOUNTER — Other Ambulatory Visit (HOSPITAL_COMMUNITY)
Admission: RE | Admit: 2023-10-10 | Discharge: 2023-10-10 | Disposition: A | Discharge status: Home / Self Care | Payer: BC Managed Care – PPO | Source: Ambulatory Visit | Attending: Family Medicine | Admitting: Family Medicine

## 2023-10-10 ENCOUNTER — Ambulatory Visit: Payer: BC Managed Care – PPO | Admitting: Family Medicine

## 2023-10-10 ENCOUNTER — Encounter: Payer: Self-pay | Admitting: Family Medicine

## 2023-10-10 VITALS — BP 128/64 | HR 96 | Temp 98.4°F | Ht 71.0 in | Wt 173.5 lb

## 2023-10-10 DIAGNOSIS — Z202 Contact with and (suspected) exposure to infections with a predominantly sexual mode of transmission: Secondary | ICD-10-CM | POA: Diagnosis not present

## 2023-10-10 DIAGNOSIS — R399 Unspecified symptoms and signs involving the genitourinary system: Secondary | ICD-10-CM | POA: Diagnosis not present

## 2023-10-10 DIAGNOSIS — Z23 Encounter for immunization: Secondary | ICD-10-CM

## 2023-10-10 LAB — POCT URINALYSIS DIPSTICK
Bilirubin, UA: NEGATIVE
Blood, UA: NEGATIVE
Glucose, UA: NEGATIVE
Ketones, UA: NEGATIVE
Leukocytes, UA: NEGATIVE
Nitrite, UA: NEGATIVE
Protein, UA: NEGATIVE
Spec Grav, UA: 1.015 (ref 1.010–1.025)
Urobilinogen, UA: 0.2 U/dL
pH, UA: 6.5 (ref 5.0–8.0)

## 2023-10-10 NOTE — Patient Instructions (Signed)
Follow up as needed or as scheduled We'll notify you of your lab results and make any changes if needed Consider Clotrimazole twice cream to take care of any possible fungal issue (like jock itch) Common things being common, this is irritation and not infection Call with any questions or concerns Happy Halloween!!

## 2023-10-10 NOTE — Progress Notes (Signed)
   Subjective:    Patient ID: Jimmy Benjamin, male    DOB: 15-Oct-1996, 27 y.o.   MRN: [id number]  HPI STD exposure- pt reports ~10 days ago developed irritation at the urethra.  Denies frequency, burning, d/c.  Has hx of gonorrhea at 39 and is very anxious about possibility of recurrence.  Mild redness at urethral meatus.  Semen is normal in color.     Review of Systems For ROS see HPI     Objective:   Physical Exam Vitals reviewed.  Constitutional:      General: He is not in acute distress.    Appearance: Normal appearance. He is not ill-appearing.  HENT:     Head: Normocephalic and atraumatic.  Genitourinary:    Comments: Pt declined exam Skin:    General: Skin is warm and dry.  Neurological:     General: No focal deficit present.     Mental Status: He is alert and oriented to person, place, and time.  Psychiatric:     Comments: Very anxious           Assessment & Plan:  STD exposure- new.  Pt has been sexually active with only his girlfriend recently.  Feels that the risk of STD is very low but he's 'traumatized' after a gonorrhea dx at 71.  Wonders if the irritation of his urethral meatus is due to using a scented lotion.  He has no other sxs.  Will get labs to r/o STI.  Discussed skin care to help improve irritation.  Pt expressed understanding and is in agreement w/ plan.

## 2023-10-11 ENCOUNTER — Encounter: Payer: Self-pay | Admitting: Family Medicine

## 2023-10-11 LAB — RPR: RPR Ser Ql: NONREACTIVE

## 2023-10-11 LAB — URINE CYTOLOGY ANCILLARY ONLY
Chlamydia: NEGATIVE
Comment: NEGATIVE
Comment: NORMAL
Neisseria Gonorrhea: NEGATIVE

## 2023-10-11 LAB — HIV ANTIBODY (ROUTINE TESTING W REFLEX): HIV 1&2 Ab, 4th Generation: NONREACTIVE

## 2023-10-14 ENCOUNTER — Ambulatory Visit: Payer: BC Managed Care – PPO | Admitting: Family Medicine

## 2023-10-14 ENCOUNTER — Telehealth: Payer: Self-pay

## 2023-10-14 NOTE — Telephone Encounter (Signed)
-----   Message from Neena Rhymes sent at 10/14/2023  7:34 AM EST ----- All STD testing is negative (normal).  This is great news!

## 2023-10-18 ENCOUNTER — Ambulatory Visit: Payer: BC Managed Care – PPO | Admitting: Professional

## 2023-10-18 ENCOUNTER — Encounter: Payer: Self-pay | Admitting: Professional

## 2023-10-18 DIAGNOSIS — F902 Attention-deficit hyperactivity disorder, combined type: Secondary | ICD-10-CM

## 2023-10-18 DIAGNOSIS — F324 Major depressive disorder, single episode, in partial remission: Secondary | ICD-10-CM

## 2023-10-18 NOTE — Progress Notes (Deleted)
[facility] Counselor Initial Adult Exam  Name: Jimmy Benjamin Date: 10/18/2023 MRN: [id number] DOB: 1996-07-20 PCP: Sheliah Hatch, MD  Time spent: 76-  Guardian/Payee:  self    Paperwork requested: Yes   Reason for Visit /Presenting Problem: The patient arrived on time for his initial appointment.    Mental Status Exam: Appearance:   {PSY:22683}     Behavior:  {PSY:21022743}  Motor:  {PSY:22302}  Speech/Language:   {PSY:22685}  Affect:  {PSY:22687}  Mood:  {PSY:31886}  Thought process:  {PSY:31888}  Thought content:    {PSY:779 545 5338}  Sensory/Perceptual disturbances:    {PSY:9047623512}  Orientation:  {PSY:30297}  Attention:  {PSY:22877}  Concentration:  {PSY:440-567-8259}  Memory:  {PSY:609-059-4846}  Fund of knowledge:   {PSY:440-567-8259}  Insight:    {PSY:440-567-8259}  Judgment:   {PSY:440-567-8259}  Impulse Control:  {PSY:440-567-8259}   Risk Assessment: Danger to Self:  {PSY:22692} Self-injurious Behavior: {PSY:22692} Danger to Others: {PSY:22692} Duty to Warn:{PSY:311194} Physical Aggression / Violence:{PSY:21197} Access to Firearms a concern: {PSY:21197} Gang Involvement:{PSY:21197} Patient / guardian was educated about steps to take if suicide or homicide risk level increases between visits: {Yes/No-Ex:120004} While future psychiatric events cannot be accurately predicted, the patient does not currently require acute inpatient psychiatric care and does not currently meet [facility] involuntary commitment criteria.  Substance Abuse History: Current substance abuse: {PSY:21197}    Past Psychiatric History:   Patient has a history of anxiety, depression, and ADHD Outpatient Providers:*** History of Psych Hospitalization: {PSY:21197} Psychological Testing: none   Abuse History:  Victim of: {Abuse History:314532}, {Type of abuse:20566}   Report needed: {PSY:314532} Victim of Neglect:{yes no:314532} Perpetrator of {PSY:20566}  Witness /  Exposure to Domestic Violence: {PSY:21197}  Protective Services Involvement: {PSY:21197} Witness to [employer] Violence:  {PSY:21197}  Family History:  Family History  Problem Relation Age of Onset   Hyperlipidemia Maternal Grandfather    Lung cancer Maternal Grandfather    Brain cancer Paternal Grandmother    Depression Mother    Lymphoma Sister     Living situation: the patient {lives:315711::"lives with their family"}  Sexual Orientation: {Sexual Orientation:[phone]}  Relationship Status: {Desc; marital status:62}  Name of spouse / other:*** If a parent, number of children / ages:***  Support Systems: {DIABETES SUPPORT:20310}  Financial Stress:  {YES/NO:21197}  Income/Employment/Disability: Patent attorney Service: No   Educational History: Education: {PSY :31912}  Religion/Sprituality/World View: {CHL AMB RELIGION/SPIRITUALITY:[phone]}  Any cultural differences that may affect / interfere with treatment:  {Religious/Cultural:200019}  Recreation/Hobbies: {Woc hobbies:30428}  Stressors: {PATIENT STRESSORS:22669}  Strengths: {Patient Coping [phone]}  Barriers:  none   Legal History: Pending legal issue / charges: {PSY:20588} History of legal issue / charges: {Legal [phone]}  Medical History/Surgical History: {Desc; reviewed/not reviewed:60074} Past Medical History:  Diagnosis Date   ADHD (attention deficit hyperactivity disorder), combined type    Anxiety    Concussion without loss of consciousness 01/2017   x 3    Depression     History reviewed. No pertinent surgical history.  Medications: Current Outpatient Medications  Medication Sig Dispense Refill   Amphetamine-Dextroamphetamine (MYDAYIS PO) Take 1 capsule by mouth daily. 15 mg two times a day (Patient not taking: Reported on 10/10/2023)  0   dicyclomine (BENTYL) 20 MG tablet Take 1 tablet (20 mg total) by mouth 3 (three) times daily as needed  for spasms. (Patient not taking: Reported on 10/10/2023) 60 tablet 3   Eluxadoline (VIBERZI) 100 MG TABS Take 1 tablet (100 mg total) by mouth 2 (two) times daily with  a meal. (Patient not taking: Reported on 10/10/2023) 60 tablet 3   FLUoxetine (PROZAC) 40 MG capsule Take 1 capsule by mouth daily.  3   No current facility-administered medications for this visit.    No Known Allergies  Diagnoses:  No diagnosis found.  Plan of Care:  -meet again on

## 2023-10-18 NOTE — Progress Notes (Addendum)
[facility] Counselor/Therapist Progress Note  Patient ID: Graysyn Freedland, MRN: [id number],    Date: 10/18/2023  Time Spent: 56 minutes 9-956am   Treatment Type: Individual Therapy  Risk Assessment: Danger to Self:  No Self-injurious Behavior: No Danger to Others: No  Subjective: The patient arrived on time for his in-person appointment.  Issues addressed: 1-pt's frustrations related to men lacking emotional intelligence -loss of friendships as result of pt getting tired of being the butt of jokes -difficulty finding where he fits in the world -consider joining a support group for emotionally intelligent men 2-professional -pt feels stuck in his job and has not received the increased responsibility he was promised at interview -pt is outspoken and was called out for it at work this week   -pt justified his concerns  -educated pt on how to modulate his emotions to avoid controversy 3-treatment planning -pt and Clinician worked on developing patient's treatment plan  -pt insightful regarding his needs that will improve his interactions with other while remaining authentic toward self  Treatment Plan Problems: Depression, Family Conflict, Vocational Stress  Symptoms: Ongoing conflict with parents, which is characterized by parents fostering dependence leading to feelings that the parents are overly involved. Maintains a residence with parents and has been unable to live independently for more than a brief period. Constant or frequent conflict with parents and/or siblings. A family that is not a stable source of positive influence or support, since family members have little or no contact with each other. Depressed or irritable mood. Diminished interest in or enjoyment of activities. Social withdrawal. History of chronic or recurrent depression for which the client has taken antidepressant medication, been hospitalized, had outpatient treatment, or had a course  of electroconvulsive therapy. Feelings of depression and anxiety related to complaints of job dissatisfaction or the stress of employment responsibilities.  Goals: Reach a level of reduced tension, increased satisfaction, and improved communication with family and/or other authority figures. Decrease the level of present conflict with parents while beginning to let go of or resolving past conflicts with them. Develop healthy interpersonal relationships that lead to the alleviation and help prevent the relapse of depression. Pursue employment consistency with a reasonably hopeful and positive attitude. Increase job satisfaction and performance due to implementation of assertiveness and stress management strategies.  Objectives target date for all objectives is 10/17/2024: Identify own as well as others' role in the family conflicts. Older children and teens learn skills for managing anger and solving problems without conflict. Report an increase in resolving conflicts with parents by talking calmly and assertively rather than aggressively and defensively. Learn and implement conflict resolution skills to resolve interpersonal problems. Increasingly verbalize hopeful and positive statements regarding self, others, and the future. Outline plan for job search. Report on job search experiences and feelings surrounding these experiences.  Interventions: Use modeling, role-playing, and behavioral rehearsal to teach the client anger control techniques that include stop, think, and act as well as cognitive problem-solving skills; role-play the application of the skills to multiple situations in the client's life. Use role-playing, role reversal, modeling, and behavioral rehearsal to help the client develop assertive ways to resolve conflict with parents (recommend Your Perfect Right: Assertiveness and Equality in Your Life and Relationships by Carnella Guadalajara). Confront the client when he/she is not  taking responsibility for his/her role in the family conflict and reinforce the client for owning responsibility for his/her contribution to the conflict. Help the client resolve depression related to interpersonal  problems through the use of reassurance and support, clarification of cognitive and affective triggers that ignite conflicts, and active problem-solving (or assign "Applying Problem-Solving to Interpersonal Conflict" in the Adult Psychotherapy Homework Planner by Stephannie Li). Assign the client to write at least one positive affirmation statement daily regarding himself/herself and the future (or assign "Positive Self-Talk" in the Adult Psychotherapy Homework Planner by Stephannie Li). Help the client develop a written job plan that contains specific attainable objectives for job search (recommend What Color Is Your Parachute?: A Practical Manual for Job-Hunters and Career-Changers by [employer]). Monitor, encourage, and process the client's search for employment.  Diagnosis: Major depressive disorder, recurrent, mild  ADHD  Plan:  -pt to recognize triggers in himself and what he does to manage his behaviors -meet again on Thursday, October 31, 2023 at [facility]

## 2023-10-18 NOTE — Progress Notes (Signed)
° ° ° ° ° ° ° ° ° ° ° ° ° ° °  Araya Roel, LCMHC °

## 2023-10-31 ENCOUNTER — Encounter: Payer: Self-pay | Admitting: Professional

## 2023-10-31 ENCOUNTER — Ambulatory Visit: Payer: BC Managed Care – PPO | Admitting: Professional

## 2023-10-31 DIAGNOSIS — F330 Major depressive disorder, recurrent, mild: Secondary | ICD-10-CM

## 2023-10-31 DIAGNOSIS — F902 Attention-deficit hyperactivity disorder, combined type: Secondary | ICD-10-CM

## 2023-10-31 NOTE — Progress Notes (Signed)
[facility] Counselor/Therapist Progress Note  Patient ID: Jimmy Benjamin, MRN: [id number],    Date: 10/31/2023  Time Spent: 47 minutes 908-955am   Treatment Type: Individual Therapy  Risk Assessment: Danger to Self:  No Self-injurious Behavior: No Danger to Others: No  Subjective: The patient arrived on time for his in-person appointment.  Issues addressed: 1-homework -pt to recognize triggers in himself and what he does to manage his behaviors 2-PHQ    10/31/2023    9:08 AM 10/10/2023    9:07 AM 01/18/2022    9:12 AM  Depression screen PHQ 2/9  Decreased Interest 1 0 0  Down, Depressed, Hopeless 1 1 1   PHQ - 2 Score 2 1 1   Altered sleeping 0 0 0  Tired, decreased energy 1 0 0  Change in appetite 0 0 0  Feeling bad or failure about yourself  0 1 1  Trouble concentrating 1 0 0  Moving slowly or fidgety/restless 0 0 0  Suicidal thoughts 0 0 0  PHQ-9 Score 4 2 2   Difficult doing work/chores  Not difficult at all Not difficult at all  2-professional -incredibly "f''n" bored -pt is bored -he is asking for work and his supervisor does not provide any -he wants a different job and has started applying but has not had any interviews -he is discontent and does not like not being productive at work -he admits that leaving school is his biggest regret since he is stuck having to work to pay back school loans 3-relationship with parents -his mother is not supportive and expects the patient to give the majority of his pay to her for his living expenses -he has nowhere to move to since he doesn't have adequate income -he cannot live with his gf due to being a 1 bedroom; she is going to try to move into a two bedroom -pt reports overall being unhappy given that he is not happy at work or at his parents home 4-relationship with girlfriend is good  Treatment Plan Problems: Depression, Family Conflict, Vocational Stress Symptoms: Ongoing conflict with parents,  which is characterized by parents fostering dependence leading to feelings that the parents are overly involved. Maintains a residence with parents and has been unable to live independently for more than a brief period. Constant or frequent conflict with parents and/or siblings. A family that is not a stable source of positive influence or support, since family members have little or no contact with each other. Depressed or irritable mood. Diminished interest in or enjoyment of activities. Social withdrawal. History of chronic or recurrent depression for which the client has taken antidepressant medication, been hospitalized, had outpatient treatment, or had a course of electroconvulsive therapy. Feelings of depression and anxiety related to complaints of job dissatisfaction or the stress of employment responsibilities.  Goals: Reach a level of reduced tension, increased satisfaction, and improved communication with family and/or other authority figures. Decrease the level of present conflict with parents while beginning to let go of or resolving past conflicts with them. Develop healthy interpersonal relationships that lead to the alleviation and help prevent the relapse of depression. Pursue employment consistency with a reasonably hopeful and positive attitude. Increase job satisfaction and performance due to implementation of assertiveness and stress management strategies.  Objectives target date for all objectives is 10/17/2024: Identify own as well as others' role in the family conflicts. Older children and teens learn skills for managing anger and solving problems without conflict. Report an increase in resolving  conflicts with parents by talking calmly and assertively rather than aggressively and defensively. Learn and implement conflict resolution skills to resolve interpersonal problems. Increasingly verbalize hopeful and positive statements regarding self, others, and the  future. Outline plan for job search. Report on job search experiences and feelings surrounding these experiences.  Interventions: Use modeling, role-playing, and behavioral rehearsal to teach the client anger control techniques that include stop, think, and act as well as cognitive problem-solving skills; role-play the application of the skills to multiple situations in the client's life. Use role-playing, role reversal, modeling, and behavioral rehearsal to help the client develop assertive ways to resolve conflict with parents (recommend Your Perfect Right: Assertiveness and Equality in Your Life and Relationships by Carnella Guadalajara). Confront the client when he/she is not taking responsibility for his/her role in the family conflict and reinforce the client for owning responsibility for his/her contribution to the conflict. Help the client resolve depression related to interpersonal problems through the use of reassurance and support, clarification of cognitive and affective triggers that ignite conflicts, and active problem-solving (or assign "Applying Problem-Solving to Interpersonal Conflict" in the Adult Psychotherapy Homework Planner by Stephannie Li). Assign the client to write at least one positive affirmation statement daily regarding himself/herself and the future (or assign "Positive Self-Talk" in the Adult Psychotherapy Homework Planner by Stephannie Li). Help the client develop a written job plan that contains specific attainable objectives for job search (recommend What Color Is Your Parachute?: A Practical Manual for Job-Hunters and Career-Changers by [employer]). Monitor, encourage, and process the client's search for employment.  Diagnosis: Major depressive disorder, recurrent, mild  Plan:  -meet again on Thursday, November 14, 2023 at [facility]

## 2023-11-14 ENCOUNTER — Encounter: Payer: Self-pay | Admitting: Professional

## 2023-11-14 ENCOUNTER — Ambulatory Visit: Payer: BC Managed Care – PPO | Admitting: Professional

## 2023-11-14 DIAGNOSIS — F324 Major depressive disorder, single episode, in partial remission: Secondary | ICD-10-CM

## 2023-11-14 DIAGNOSIS — F902 Attention-deficit hyperactivity disorder, combined type: Secondary | ICD-10-CM

## 2023-11-14 DIAGNOSIS — F330 Major depressive disorder, recurrent, mild: Secondary | ICD-10-CM

## 2023-11-14 NOTE — Progress Notes (Signed)
[facility] Counselor/Therapist Progress Note  Patient ID: Jimmy Benjamin, MRN: [id number],    Date: 11/14/2023  Time Spent: 43 minutes [id number]   Treatment Type: Individual Therapy  Risk Assessment: Danger to Self:  No Self-injurious Behavior: No Danger to Others: No  Subjective: The patient arrived on time for his in-person appointment.  Issues addressed: 1-mood -pt has noticed improved mood over past two weeks -reports he is feeling more positive 2-interpersonal relationships -pt shared details related to loss of friendship with his friend group when he was 5 -pt initially focused on what his friends did/didn't do that resulted in him being ostracized -pt able to identify his role in the dissolution of the relationship -he admits that he made assumptions about the friend group distancing themselves -discussed all or not thinking as pt was saying none of the friends cared enough to reach out   -pt shared he still has relationships with four of the young men who were at the "blackout event" 3-brain development and maturity -explained brain growth related to front lobe (judgment)  Treatment Plan Problems: Depression, Family Conflict, Vocational Stress Symptoms: Ongoing conflict with parents, which is characterized by parents fostering dependence leading to feelings that the parents are overly involved. Maintains a residence with parents and has been unable to live independently for more than a brief period. Constant or frequent conflict with parents and/or siblings. A family that is not a stable source of positive influence or support, since family members have little or no contact with each other. Depressed or irritable mood. Diminished interest in or enjoyment of activities. Social withdrawal. History of chronic or recurrent depression for which the client has taken antidepressant medication, been hospitalized, had outpatient treatment, or had a course  of electroconvulsive therapy. Feelings of depression and anxiety related to complaints of job dissatisfaction or the stress of employment responsibilities.  Goals: Reach a level of reduced tension, increased satisfaction, and improved communication with family and/or other authority figures. Decrease the level of present conflict with parents while beginning to let go of or resolving past conflicts with them. Develop healthy interpersonal relationships that lead to the alleviation and help prevent the relapse of depression. Pursue employment consistency with a reasonably hopeful and positive attitude. Increase job satisfaction and performance due to implementation of assertiveness and stress management strategies.  Objectives target date for all objectives is 10/17/2024: Identify own as well as others' role in the family conflicts. Older children and teens learn skills for managing anger and solving problems without conflict. Report an increase in resolving conflicts with parents by talking calmly and assertively rather than aggressively and defensively. Learn and implement conflict resolution skills to resolve interpersonal problems. Increasingly verbalize hopeful and positive statements regarding self, others, and the future. Outline plan for job search. Report on job search experiences and feelings surrounding these experiences.  Interventions: Use modeling, role-playing, and behavioral rehearsal to teach the client anger control techniques that include stop, think, and act as well as cognitive problem-solving skills; role-play the application of the skills to multiple situations in the client's life. Use role-playing, role reversal, modeling, and behavioral rehearsal to help the client develop assertive ways to resolve conflict with parents (recommend Your Perfect Right: Assertiveness and Equality in Your Life and Relationships by Carnella Guadalajara). Confront the client when he/she is not  taking responsibility for his/her role in the family conflict and reinforce the client for owning responsibility for his/her contribution to the conflict. Help the client resolve depression  related to interpersonal problems through the use of reassurance and support, clarification of cognitive and affective triggers that ignite conflicts, and active problem-solving (or assign "Applying Problem-Solving to Interpersonal Conflict" in the Adult Psychotherapy Homework Planner by Stephannie Li). Assign the client to write at least one positive affirmation statement daily regarding himself/herself and the future (or assign "Positive Self-Talk" in the Adult Psychotherapy Homework Planner by Stephannie Li). Help the client develop a written job plan that contains specific attainable objectives for job search (recommend What Color Is Your Parachute?: A Practical Manual for Job-Hunters and Career-Changers by [employer]). Monitor, encourage, and process the client's search for employment.  Diagnosis: Major depressive disorder, recurrent, mild  Plan:  -meet again on Thursday, November 14, 2023 at [facility]

## 2023-11-26 ENCOUNTER — Ambulatory Visit: Payer: BC Managed Care – PPO | Admitting: Professional

## 2023-11-28 ENCOUNTER — Ambulatory Visit: Payer: BC Managed Care – PPO | Admitting: Professional

## 2023-12-19 ENCOUNTER — Encounter: Payer: Self-pay | Admitting: Professional

## 2023-12-19 ENCOUNTER — Ambulatory Visit (INDEPENDENT_AMBULATORY_CARE_PROVIDER_SITE_OTHER): Payer: BC Managed Care – PPO | Admitting: Professional

## 2023-12-19 DIAGNOSIS — F902 Attention-deficit hyperactivity disorder, combined type: Secondary | ICD-10-CM

## 2023-12-19 DIAGNOSIS — F324 Major depressive disorder, single episode, in partial remission: Secondary | ICD-10-CM

## 2023-12-19 NOTE — Progress Notes (Signed)
 [facility] Counselor/Therapist Progress Note  Patient ID: Jimmy Benjamin, MRN: [id number],    Date: 12/19/2023  Time Spent: 49 minutes 401-450pm   Treatment Type: Individual Therapy  Risk Assessment: Danger to Self:  No Self-injurious Behavior: No Danger to Others: No  Subjective: The patient arrived on time for his in-person appointment.  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 -plans to screenshot jobs of interest, apply for, print out, and visit the prospective employer 3-relationships a-girlfriend -is great, they had a good christmas together until his parent returned home from sister's after nearly two weeks b-parents -got kicked out of sisters home day after christmas due to mother's drunken behavior and father's verbal outbursts aimed at protecting pt's mother -pt's is hoping to say enough money to move out -pt has stopped drinking for Jan 2025 and plans to decreased drinking behavior when he introduces alcohol 3-self-motivation to change -pt concerned a great deal for what is not going right in his life -suggested pt consider looking for things that will help improve his quality of life  Treatment Plan Problems: Depression, Family Conflict, Vocational Stress Symptoms: Ongoing conflict with parents, which is characterized by parents fostering dependence leading to feelings that the parents are overly involved. Maintains a residence with parents and has been unable to live independently for  more than a brief period. Constant or frequent conflict with parents and/or siblings. A family that is not a stable source of positive influence or support, since family members have little or no contact with each other. Depressed or irritable mood. Diminished interest in or enjoyment of activities. Social withdrawal. History of chronic or recurrent depression for which the client has taken antidepressant medication, been hospitalized, had outpatient treatment, or had a course of electroconvulsive therapy. Feelings of depression and anxiety related to complaints of job dissatisfaction or the stress of employment responsibilities.  Goals: Reach a level of reduced tension, increased satisfaction, and improved communication with family and/or other authority figures. Decrease the level of present conflict with parents while beginning to let go of or resolving past conflicts with them. Develop healthy interpersonal relationships that lead to the alleviation and help prevent the relapse of depression. Pursue employment consistency with a reasonably hopeful and positive attitude. Increase job satisfaction and performance due to implementation of assertiveness and stress management strategies.  Objectives target date for all objectives is 10/17/2024: Identify own as well as others' role in the family conflicts. Older children and teens learn skills for managing anger and solving problems without conflict. Report an increase in resolving conflicts with parents by talking calmly and assertively rather than aggressively and defensively. Learn and implement conflict resolution skills to resolve interpersonal problems. Increasingly verbalize hopeful and positive statements regarding self, others, and the future. Outline plan for job search. Report on job search experiences and feelings surrounding these experiences.  Interventions: Use modeling, role-playing, and behavioral rehearsal to teach the client  anger control techniques that include stop, think, and act as well as cognitive problem-solving skills; role-play the application of the skills to multiple situations in the  client's life. Use role-playing, role reversal, modeling, and behavioral rehearsal to help the client develop assertive ways to resolve conflict with parents (recommend Your Perfect Right: Assertiveness and Equality in Your Life and Relationships by Darrelyn armin Sick). Confront the client when he/she is not taking responsibility for his/her role in the family conflict and reinforce the client for owning responsibility for his/her contribution to the conflict. Help the client resolve depression related to interpersonal problems through the use of reassurance and support, clarification of cognitive and affective triggers that ignite conflicts, and active problem-solving (or assign Applying Problem-Solving to Interpersonal Conflict in the Adult Psychotherapy Homework Planner by Jenniffer). Assign the client to write at least one positive affirmation statement daily regarding himself/herself and the future (or assign Positive Self-Talk in the Adult Psychotherapy Homework Planner by Jenniffer). Help the client develop a written job plan that contains specific attainable objectives for job search (recommend What Color Is Your Parachute?: A Practical Manual for Job-Hunters and Career-Changers by [employer]). Monitor, encourage, and process the client's search for employment.  Diagnosis: Major depressive disorder, recurrent, mild  Plan:  -meet again on Monday, January 06, 2024 at [facility]

## 2023-12-24 DIAGNOSIS — F902 Attention-deficit hyperactivity disorder, combined type: Secondary | ICD-10-CM | POA: Diagnosis not present

## 2023-12-24 DIAGNOSIS — Z79899 Other long term (current) drug therapy: Secondary | ICD-10-CM | POA: Diagnosis not present

## 2024-01-06 ENCOUNTER — Ambulatory Visit: Payer: BC Managed Care – PPO | Admitting: Professional

## 2024-01-30 ENCOUNTER — Ambulatory Visit (INDEPENDENT_AMBULATORY_CARE_PROVIDER_SITE_OTHER): Payer: BC Managed Care – PPO | Admitting: Professional

## 2024-01-30 ENCOUNTER — Encounter: Payer: Self-pay | Admitting: Professional

## 2024-01-30 DIAGNOSIS — F330 Major depressive disorder, recurrent, mild: Secondary | ICD-10-CM | POA: Diagnosis not present

## 2024-01-30 DIAGNOSIS — F902 Attention-deficit hyperactivity disorder, combined type: Secondary | ICD-10-CM

## 2024-01-30 DIAGNOSIS — F324 Major depressive disorder, single episode, in partial remission: Secondary | ICD-10-CM

## 2024-01-30 NOTE — Progress Notes (Signed)
 [facility] Counselor/Therapist Progress Note  Patient ID: Jimmy Benjamin, MRN: [id number],    Date: 01/30/2024  Time Spent: 41 minutes [phone]   Treatment Type: Individual Therapy  Risk Assessment: Danger to Self:  No Self-injurious Behavior: No Danger to Others: No  Subjective: The patient arrived on time for his in-person appointment.  Issues addressed: 1-pt reports "changes are being made for sure" 2-professional -"work is work and nothing has really changed" -it's not going anywhere toward future but he is still looking for another job -he is running into issues of not having a degree -slightly checked out of work and not letting it bother him as much 3-family -the situation over Christmas was brought up -his mother did not expect him to ever discuss -he told her he would discuss if he wanted -middle sister Lelon Mast is going to talk with them -conversation with Lelon Mast about older sister Dot Lanes -he has decided to go no contact with sister -he did not want this but she has not been there for him and when they have a phone call she does not pick up -Samantha made some excuses for Dot Lanes that she has a lot going on and that she is dealing with a lot of anxiety -"I depended on you and your left me with the thing that I don't like the most" -he wants a whole year to pass of no contact to see if she misses him in her life -he doesn't want to spend time with her children and doesn't like children -he is cutting her off for his own good, setting his own boundaries -he is tired of spending his efforts trying to have a relationship with Dot Lanes for three years -Lelon Mast is the peacemaker "over the petty things" they have always called her "the Mother Lane Hacker" -she is able to empathize with him and is supportive -he knows cutting Dot Lanes off it's what he needs -he feels abandoned by [place]; they used to be safe -Dot Lanes raised him and took care of him   -5th-12th  grade only a phone call away   -she gets married, has a baby and [place] and is no longer available   -she is the only other left learning person in his family -he has reconnected with his paternal aunt Gi Gi by marriage to [place]   -uncle has been sober for 13 years   -no contact with other uncle who abandoned his family   -maternal uncle was dead to me long before he died last year -aunt and family lives in [place] -he regularly met with her for 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 [place] -he is disappointed with his sister not investing in their relationship -he doesn't really think about that often, it's not really effecting him day to day -there has been no contact in four weeks -he doesn't think his sister is even aware that he has cut her off -he does a lot for people and feels let down by [place] -last year birthday was the first time she did not send him a birthday gift -it's not about the gift it's not being though of -family members have lack of accountability -his parents expect him to treat them the way they expect but they do not treat him the same -the accountability of respecting your children's feelings -his parents do not have any insight and believe they can do what they want regardless of what anyone else thinks -pt is trying to set small changes -their emotional availability  is minimal and they will not attempt change -her mother will ask him for a ride but then texts him at the last minute to say she will just work from home  Treatment Plan Problems: Depression, Family Conflict, Vocational Stress Symptoms: Ongoing conflict with parents, which is characterized by parents fostering dependence leading to feelings that the parents are overly involved. Maintains a residence with parents and has been unable to live independently for more than a brief period. Constant or frequent conflict with parents and/or siblings. A family that is not a stable source of positive  influence or support, since family members have little or no contact with each other. Depressed or irritable mood. Diminished interest in or enjoyment of activities. Social withdrawal. History of chronic or recurrent depression for which the client has taken antidepressant medication, been hospitalized, had outpatient treatment, or had a course of electroconvulsive therapy. Feelings of depression and anxiety related to complaints of job dissatisfaction or the stress of employment responsibilities. Goals: Reach a level of reduced tension, increased satisfaction, and improved communication with family and/or other authority figures. Decrease the level of present conflict with parents while beginning to let go of or resolving past conflicts with them. Develop healthy interpersonal relationships that lead to the alleviation and help prevent the relapse of depression. Pursue employment consistency with a reasonably hopeful and positive attitude. Increase job satisfaction and performance due to implementation of assertiveness and stress management strategies. Objectives target date for all objectives is 10/17/2024: Identify own as well as others' role in the family conflicts. Older children and teens learn skills for managing anger and solving problems without conflict. Report an increase in resolving conflicts with parents by talking calmly and assertively rather than aggressively and defensively. Learn and implement conflict resolution skills to resolve interpersonal problems. Increasingly verbalize hopeful and positive statements regarding self, others, and the future. Outline plan for job search. Report on job search experiences and feelings surrounding these experiences. Interventions: Use modeling, role-playing, and behavioral rehearsal to teach the client anger control techniques that include stop, think, and act as well as cognitive problem-solving skills; role-play the application of the  skills to multiple situations in the client's life. Use role-playing, role reversal, modeling, and behavioral rehearsal to help the client develop assertive ways to resolve conflict with parents (recommend Your Perfect Right: Assertiveness and Equality in Your Life and Relationships by Carnella Guadalajara). Confront the client when he/she is not taking responsibility for his/her role in the family conflict and reinforce the client for owning responsibility for his/her contribution to the conflict. Help the client resolve depression related to interpersonal problems through the use of reassurance and support, clarification of cognitive and affective triggers that ignite conflicts, and active problem-solving (or assign "Applying Problem-Solving to Interpersonal Conflict" in the Adult Psychotherapy Homework Planner by Stephannie Li). Assign the client to write at least one positive affirmation statement daily regarding himself/herself and the future (or assign "Positive Self-Talk" in the Adult Psychotherapy Homework Planner by Stephannie Li). Help the client develop a written job plan that contains specific attainable objectives for job search (recommend What Color Is Your Parachute?: A Practical Manual for Job-Hunters and Career-Changers by [employer]). Monitor, encourage, and process the client's search for employment.  Diagnosis: Major depressive disorder, recurrent, mild  Plan:  -meet again on Thursday, February 20, 2024 at 4pm in person

## 2024-02-06 ENCOUNTER — Encounter: Payer: Self-pay | Admitting: Family Medicine

## 2024-02-06 ENCOUNTER — Ambulatory Visit (INDEPENDENT_AMBULATORY_CARE_PROVIDER_SITE_OTHER): Payer: BC Managed Care – PPO | Admitting: Family Medicine

## 2024-02-06 VITALS — BP 110/62 | HR 78 | Temp 98.0°F | Ht 71.0 in | Wt 172.5 lb

## 2024-02-06 DIAGNOSIS — K623 Rectal prolapse: Secondary | ICD-10-CM

## 2024-02-06 NOTE — Patient Instructions (Signed)
 Follow up as needed or as scheduled We'll call you to schedule your GI appt Drink LOTS of water Keep stools soft Try and avoid straining- during bowel movements, heavy lifting, etc If pain worsens or bulge becomes bigger- please go to the ER for evaluation Call with any questions or concerns Hang in there!

## 2024-02-06 NOTE — Progress Notes (Signed)
   Subjective:    Patient ID: Jimmy Benjamin, male    DOB: 1996-07-02, 28 y.o.   MRN: [id number]  HPI 'i think I have a partial rectal prolapse'- pt reports he had similar ~4 yrs ago.  First noticed on Sunday.  Initially thought it was a hemorrhoid but then decided it was not that.  He reports 'a pretty substantial bulge' from rectum.  No bleeding.  Burgess Estelle was 'chugging miralax' due to constipation which is very unusual for him.  Typically has multiple stools daily.     Review of Systems For ROS see HPI     Objective:   Physical Exam Vitals reviewed. Exam conducted with a chaperone present.  Constitutional:      General: He is not in acute distress.    Appearance: Normal appearance. He is not ill-appearing.  HENT:     Head: Normocephalic and atraumatic.  Cardiovascular:     Rate and Rhythm: Normal rate and regular rhythm.  Pulmonary:     Effort: Pulmonary effort is normal. No respiratory distress.  Genitourinary:    Rectum: Mass (flesh colored knot at rectal opening that was exquisitely sensitive to palpation.  not violaceous or vascular, no ulcerations noted) present.  Skin:    General: Skin is warm and dry.  Neurological:     General: No focal deficit present.     Mental Status: He is alert and oriented to person, place, and time.  Psychiatric:        Mood and Affect: Mood normal.        Behavior: Behavior normal.        Thought Content: Thought content normal.           Assessment & Plan:  Partial rectal prolapse- new.  Pt reports he had similar situation years ago that self resolved.  Given his hx of IBS and now 2 episodes of rectal prolapse, he is concerned about possibility of IBD.  Given the degree of tenderness of the area, I was not able to get a good exam but does not seem consistent w/ hemorrhoid.  Refer to GI for complete evaluation and tx.  Pt expressed understanding and is in agreement w/ plan.

## 2024-02-11 ENCOUNTER — Other Ambulatory Visit (INDEPENDENT_AMBULATORY_CARE_PROVIDER_SITE_OTHER)

## 2024-02-11 ENCOUNTER — Ambulatory Visit: Payer: BC Managed Care – PPO | Admitting: Gastroenterology

## 2024-02-11 ENCOUNTER — Encounter: Payer: Self-pay | Admitting: Gastroenterology

## 2024-02-11 VITALS — BP 118/68 | HR 93 | Ht 71.0 in | Wt 169.4 lb

## 2024-02-11 DIAGNOSIS — K580 Irritable bowel syndrome with diarrhea: Secondary | ICD-10-CM

## 2024-02-11 DIAGNOSIS — K625 Hemorrhage of anus and rectum: Secondary | ICD-10-CM | POA: Diagnosis not present

## 2024-02-11 DIAGNOSIS — R109 Unspecified abdominal pain: Secondary | ICD-10-CM

## 2024-02-11 LAB — COMPREHENSIVE METABOLIC PANEL
ALT: 30 U/L (ref 0–53)
AST: 17 U/L (ref 0–37)
Albumin: 4.7 g/dL (ref 3.5–5.2)
Alkaline Phosphatase: 55 U/L (ref 39–117)
BUN: 15 mg/dL (ref 6–23)
CO2: 31 meq/L (ref 19–32)
Calcium: 9.6 mg/dL (ref 8.4–10.5)
Chloride: 101 meq/L (ref 96–112)
Creatinine, Ser: 0.91 mg/dL (ref 0.40–1.50)
GFR: 117.17 mL/min (ref >60.00)
Glucose, Bld: 98 mg/dL (ref 70–99)
Potassium: 4.2 meq/L (ref 3.5–5.1)
Sodium: 138 meq/L (ref 135–145)
Total Bilirubin: 2 mg/dL — ABNORMAL HIGH (ref 0.2–1.2)
Total Protein: 7.2 g/dL (ref 6.0–8.3)

## 2024-02-11 LAB — CBC WITH DIFFERENTIAL/PLATELET
Basophils Absolute: 0 10*3/uL (ref 0.0–0.1)
Basophils Relative: 0.3 % (ref 0.0–3.0)
Eosinophils Absolute: 0 10*3/uL (ref 0.0–0.7)
Eosinophils Relative: 0.8 % (ref 0.0–5.0)
HCT: 45.1 % (ref 39.0–52.0)
Hemoglobin: 15.6 g/dL (ref 13.0–17.0)
Lymphocytes Relative: 28.5 % (ref 12.0–46.0)
Lymphs Abs: 1.6 10*3/uL (ref 0.7–4.0)
MCHC: 34.7 g/dL (ref 30.0–36.0)
MCV: 92.4 fl (ref 78.0–100.0)
Monocytes Absolute: 0.5 10*3/uL (ref 0.1–1.0)
Monocytes Relative: 8.3 % (ref 3.0–12.0)
Neutro Abs: 3.4 10*3/uL (ref 1.4–7.7)
Neutrophils Relative %: 62.1 % (ref 43.0–77.0)
Platelets: 297 10*3/uL (ref 150.0–400.0)
RBC: 4.88 Mil/uL (ref 4.22–5.81)
RDW: 12.2 % (ref 11.5–15.5)
WBC: 5.5 10*3/uL (ref 4.0–10.5)

## 2024-02-11 LAB — C-REACTIVE PROTEIN: CRP: <1 mg/dL (ref 0.5–20.0)

## 2024-02-11 NOTE — Patient Instructions (Addendum)
 _______________________________________________________  If your blood pressure at your visit was 140/90 or greater, please contact your primary care physician to follow up on this.  _______________________________________________________  If you are age 26 or older, your body mass index should be between 23-30. Your Body mass index is 23.62 kg/m. If this is out of the aforementioned range listed, please consider follow up with your Primary Care Provider.  If you are age 48 or younger, your body mass index should be between 19-25. Your Body mass index is 23.62 kg/m. If this is out of the aformentioned range listed, please consider follow up with your Primary Care Provider.   ________________________________________________________  The [facility] providers would like to encourage you to use [facility] to communicate with providers for non-urgent requests or questions.  Due to long hold times on the telephone, sending your provider a message by [facility] may be a faster and more efficient way to get a response.  Please allow 48 business hours for a response.  Please remember that this is for non-urgent requests.  _______________________________________________________  Your provider has requested that you go to the basement level for lab work before leaving today. Press "B" on the elevator. The lab is located at the first door on the left as you exit the elevator.  Stop all sodas including beer  You have been scheduled for a colonoscopy. Please follow written instructions given to you at your visit today.   If you use inhalers (even only as needed), please bring them with you on the day of your procedure.  DO NOT TAKE 7 DAYS PRIOR TO TEST- Trulicity (dulaglutide) Ozempic, Wegovy (semaglutide) Mounjaro (tirzepatide) Bydureon Bcise (exanatide extended release)  DO NOT TAKE 1 DAY PRIOR TO YOUR TEST Rybelsus (semaglutide) Adlyxin (lixisenatide) Victoza (liraglutide) Byetta  (exanatide) ___________________________________________________________________________  Bonita Quin will receive your bowel preparation through [facility], which ensures the lowest copay and home delivery, with outreach via text or call from an [phone] number. Please respond promptly to avoid rescheduling of your procedure. If you are interested in alternative options or have any questions regarding your prep, please contact them at [phone] ____________________________________________________________________________  Your Provider Has Sent Your Bowel Prep Regimen To [facility]   [facility] will contact you to verify your information and collect your copay, if applicable. Enjoy the comfort of your home while your prescription is mailed to you, FREE of any shipping charges.   [facility] accepts all major insurance benefits and applies discounts & coupons.  Have additional questions?   Chat: [email or web address] Call: [phone] Email: [email or web address] NCPDP: [id number]  How will [facility] contact you?  With a Welcome phone call,  a Welcome text and a checkout link in text form.  Texts you receive from [phone] Are NOT Spam.  *To set up delivery, you must complete the checkout process via link or speak to one of the patient care representatives. If [facility] is unable to reach you, your prescription may be delayed.  To avoid long hold times on the phone, you may also utilize the secure chat feature on the [facility] website to request that they call you back for transaction completion or to expedite your concerns.   Thank you,  Dr. Lynann Bologna

## 2024-02-11 NOTE — Progress Notes (Signed)
 Chief Complaint: Longstanding GI problems  Referring Provider:  Sheliah Hatch, MD      ASSESSMENT AND PLAN;   #1. Rectal bleeding with ?prolapse vs hoids #2. IBS-D #3. Abdo pain likely d/t #2  Plan: -CBC, CMP, CRP, celiac -Colon with miralax -Stop all sodas including beer -If still with problems, CT AP with contrast   HPI:    Jimmy Benjamin is a 28 y.o. male  With ADHD, anxiet/depression, IBS  He has a long-standing history of gastrointestinal issues, beginning with severe gas pains during high school, which progressed to stomach aches and frequent diarrhea. He now experiences a solid bowel movement approximately once a week, with the rest being watery or accompanied by significant gas. Discomfort and pain are often present, with feelings of constipation and straining but no relief. Gas pains are localized around and behind the belly button.  Approximately a week and a half ago, he experienced constipation and straining, leading to a prolapse, which is the second occurrence, the first being a few years ago. He has been performing pelvic floor exercises, which previously helped resolve the issue. The prolapse is still visible but less sensitive than a week ago, and he can now sit without discomfort, which was previously painful, especially given his job as a [employer].  Stress exacerbates his diarrhea, increasing its severity. Dietary changes, including veganism and gluten-free diets, have not significantly impacted his symptoms. He finds that spicy food helps alleviate some symptoms, and he has reduced his alcohol consumption significantly, now having a beer occasionally, which does not seem to worsen his gas issues.  He recalls a period of rectal bleeding during a stressful time in 2022, but no recent occurrences. No nausea or vomiting except for a recent episode of food poisoning, and he occasionally experiences heartburn.  He occasionally takes ibuprofen for  headaches, ensuring it is not on an empty stomach, and denies any history of black stools.  He has a family history of IBS, with his mother also affected.     Stool studies not performed as he thought he could not give a stool specimen.  Age 48  Rectal exam  01/21/2022 OV- Stomach issues- pt reports sxs started in high school. 'sharp pains, frequent bathroom visits'. Pt feels he has IBS as a lot of his sxs are stress related. Pt was let go from his job b/c he 'used the bathroom too much' Pains are intermittent but he feels pain is 'pretty bad at least once a week'. Currently using the bathroom 4-5x/day. Stools are loose. Denies constipation. Does not see blood in stool. Pt has used Tums, Gas-X, Imodium. Will start Viberzi to help slow the stools and add Dicyclomine as needed for abd pain and spasm.  Reviewed supportive care and Jimmy flags that should prompt return.  Pt expressed understanding and is in agreement w/ plan   Past Medical History:  Diagnosis Date   ADHD (attention deficit hyperactivity disorder), combined type    Anxiety    Concussion without loss of consciousness 01/2017   x 3    Depression     No past surgical history on file.  Family History  Problem Relation Age of Onset   Depression Mother    Irritable bowel syndrome Mother    Lymphoma Sister    Hyperlipidemia Maternal Grandfather    Lung cancer Maternal Grandfather    Brain cancer Paternal Grandmother     Social History   Tobacco Use   Smoking status: Never  Smokeless tobacco: Never  Vaping Use   Vaping status: Every Day  Substance Use Topics   Alcohol use: Yes    Alcohol/week: 0.0 standard drinks of alcohol    Comment: socially at college   Drug use: No    Current Outpatient Medications  Medication Sig Dispense Refill   FLUoxetine (PROZAC) 40 MG capsule Take 1 capsule by mouth daily.  3   No current facility-administered medications for this visit.    No Known Allergies  Review of Systems:   Constitutional: Denies fever, chills, diaphoresis, appetite change and fatigue.  HEENT: Denies photophobia, eye pain, redness, hearing loss, ear pain, congestion, sore throat, rhinorrhea, sneezing, mouth sores, neck pain, neck stiffness and tinnitus.   Respiratory: Denies SOB, DOE, cough, chest tightness,  and wheezing.   Cardiovascular: Denies chest pain, palpitations and leg swelling.  Genitourinary: Denies dysuria, urgency, frequency, hematuria, flank pain and difficulty urinating.  Musculoskeletal: Denies myalgias, back pain, joint swelling, arthralgias and gait problem.  Skin: No rash.  Neurological: Denies dizziness, seizures, syncope, weakness, light-headedness, numbness and headaches.  Hematological: Denies adenopathy. Easy bruising, personal or family bleeding history  Psychiatric/Behavioral: Has anxiety or depression     Physical Exam:    BP 118/68   Pulse 93   Ht 5\' 11"  (1.803 m)   Wt 169 lb 6 oz (76.8 kg)   SpO2 98%   BMI 23.62 kg/m  Wt Readings from Last 3 Encounters:  02/11/24 169 lb 6 oz (76.8 kg)  02/06/24 172 lb 8 oz (78.2 kg)  10/10/23 173 lb 8 oz (78.7 kg)   Constitutional:  Well-developed, in no acute distress. Psychiatric: Normal mood and affect. Behavior is normal. HEENT: Pupils normal.  Conjunctivae are normal. No scleral icterus. Cardiovascular: Normal rate, regular rhythm. No edema Pulmonary/chest: Effort normal and breath sounds normal. No wheezing, rales or rhonchi. Abdominal: Soft, nondistended. Nontender. Bowel sounds active throughout. There are no masses palpable. No hepatomegaly. Rectal: In presence of Gloria, I do see small external hemorrhoid.  No obvious prolapse.  He had tenderness and voluntary "guarding"-hence digital examination not performed. Neurological: Alert and oriented to person place and time. Skin: Skin is warm and dry. No rashes noted.  Data Reviewed: I have personally reviewed following labs and imaging studies  CBC:     Latest Ref Rng & Units 08/11/2018   10:08 PM  CBC  WBC 4.0 - 10.5 K/uL 6.5   Hemoglobin 13.0 - 17.0 g/dL 40.9   Hematocrit 81.1 - 52.0 % 45.7   Platelets 150 - 400 K/uL 268     CMP:    Latest Ref Rng & Units 08/11/2018   10:08 PM  CMP  Glucose 70 - 99 mg/dL 91   BUN 6 - 20 mg/dL 11   Creatinine 9.14 - 1.24 mg/dL 7.82   Sodium 956 - 213 mmol/L 137   Potassium 3.5 - 5.1 mmol/L 3.9   Chloride 98 - 111 mmol/L 96   CO2 22 - 32 mmol/L 29   Calcium 8.9 - 10.3 mg/dL 9.1         Edman Circle, MD 02/11/2024, 11:09 AM  Cc: Sheliah Hatch, MD

## 2024-02-17 LAB — CELIAC PANEL 10
Antigliadin Abs, IgA: 5 U (ref 0–19)
Endomysial IgA: NEGATIVE
Gliadin IgG: 1 U (ref 0–19)
IgA/Immunoglobulin A, Serum: 230 mg/dL (ref 90–386)
Tissue Transglut Ab: 3 U/mL (ref 0–5)
Transglutaminase IgA: <2 U/mL (ref 0–3)

## 2024-02-20 ENCOUNTER — Ambulatory Visit: Payer: BC Managed Care – PPO | Admitting: Professional

## 2024-02-20 ENCOUNTER — Encounter: Payer: Self-pay | Admitting: Professional

## 2024-02-20 DIAGNOSIS — F324 Major depressive disorder, single episode, in partial remission: Secondary | ICD-10-CM | POA: Diagnosis not present

## 2024-02-20 DIAGNOSIS — F902 Attention-deficit hyperactivity disorder, combined type: Secondary | ICD-10-CM

## 2024-02-20 NOTE — Progress Notes (Unsigned)
 [facility] Counselor/Therapist Progress Note  Patient ID: Jimmy Benjamin, MRN: [id number],    Date: 02/20/2024  Time Spent: 62 minutes [id number]   Treatment Type: Individual Therapy  Risk Assessment: Danger to Self:  No Self-injurious Behavior: No Danger to Others: No  Subjective: The patient arrived on time for his in-person appointment.  Issues addressed: 1-anxiety a-much greater due to political climate -loan repayment just began and he had to take a chunk of money this week -mother has secured an attorney due to her cosign of pt and his sister's loan   -has found an attorney willing to take the case b-tries to limit but refuses to not remain informed c-worries about his future -plan for himself and do not consider from a political viewpoint -avoid being consumed with the "what if's" d-how to better manage anxiety -educated on worry time and how to utilize -consider alternative activities that you can control   -involvement in a political group   -get involved in community -explore opportunities for career, financial, housing, and relationships   -thinking about radiology school but has no ability to access a loan at this point 2-professional -has only been there 8 months and it feels like much longer -looking to remain in position due to good income -wants to get vested in his 49k payout which will be in July at one year -continues to look but limited opportunities 3-relationship issues -planning to break up with gf of one year -saw ex-girlfriend that he regrets walking away -understanding values and what traits you want in a life partner -processed relationship issues over a number of years   -knows he has grown in his understanding of relationships  Treatment Plan Problems: Depression, Family Conflict, Vocational Stress Symptoms: Ongoing conflict with parents, which is characterized by parents fostering dependence leading to feelings that the  parents are overly involved. Maintains a residence with parents and has been unable to live independently for more than a brief period. Constant or frequent conflict with parents and/or siblings. A family that is not a stable source of positive influence or support, since family members have little or no contact with each other. Depressed or irritable mood. Diminished interest in or enjoyment of activities. Social withdrawal. History of chronic or recurrent depression for which the client has taken antidepressant medication, been hospitalized, had outpatient treatment, or had a course of electroconvulsive therapy. Feelings of depression and anxiety related to complaints of job dissatisfaction or the stress of employment responsibilities. Goals: Reach a level of reduced tension, increased satisfaction, and improved communication with family and/or other authority figures. Decrease the level of present conflict with parents while beginning to let go of or resolving past conflicts with them. Develop healthy interpersonal relationships that lead to the alleviation and help prevent the relapse of depression. Pursue employment consistency with a reasonably hopeful and positive attitude. Increase job satisfaction and performance due to implementation of assertiveness and stress management strategies. Objectives target date for all objectives is 10/17/2024: Identify own as well as others' role in the family conflicts. Older children and teens learn skills for managing anger and solving problems without conflict. Report an increase in resolving conflicts with parents by talking calmly and assertively rather than aggressively and defensively. Learn and implement conflict resolution skills to resolve interpersonal problems. Increasingly verbalize hopeful and positive statements regarding self, others, and the future. Outline plan for job search. Report on job search experiences and feelings surrounding  these experiences. Interventions: Use modeling, role-playing, and behavioral  rehearsal to teach the client anger control techniques that include stop, think, and act as well as cognitive problem-solving skills; role-play the application of the skills to multiple situations in the client's life. Use role-playing, role reversal, modeling, and behavioral rehearsal to help the client develop assertive ways to resolve conflict with parents (recommend Your Perfect Right: Assertiveness and Equality in Your Life and Relationships by Carnella Guadal[place]). Confront the client when he/she is not taking responsibility for his/her role in the family conflict and reinforce the client for owning responsibility for his/her contribution to the conflict. Help the client resolve depression related to interpersonal problems through the use of reassurance and support, clarification of cognitive and affective triggers that ignite conflicts, and active problem-solving (or assign "Applying Problem-Solving to Interpersonal Conflict" in the Adult Psychotherapy Homework Planner by Stephannie Li). Assign the client to write at least one positive affirmation statement daily regarding himself/herself and the future (or assign "Positive Self-Talk" in the Adult Psychotherapy Homework Planner by Stephannie Li). Help the client develop a written job plan that contains specific attainable objectives for job search (recommend What Color Is Your Parachute?: A Practical Manual for Job-Hunters and Career-Changers by [employer]). Monitor, encourage, and process the client's search for employment.  Diagnosis: Major depressive disorder, recurrent, mild  Plan:  -meet again on Thursday, February 20, 2024 at 4pm in person

## 2024-03-09 ENCOUNTER — Ambulatory Visit: Payer: BC Managed Care – PPO

## 2024-03-20 ENCOUNTER — Ambulatory Visit: Admitting: Professional

## 2024-03-27 ENCOUNTER — Encounter: Payer: Self-pay | Admitting: Gastroenterology

## 2024-04-02 ENCOUNTER — Ambulatory Visit: Admitting: Professional

## 2024-04-06 ENCOUNTER — Encounter: Payer: Self-pay | Admitting: Gastroenterology

## 2024-04-06 ENCOUNTER — Ambulatory Visit: Admitting: Gastroenterology

## 2024-04-06 VITALS — BP 109/57 | HR 64 | Temp 98.6°F | Resp 22 | Ht 71.0 in | Wt 169.0 lb

## 2024-04-06 DIAGNOSIS — K640 First degree hemorrhoids: Secondary | ICD-10-CM

## 2024-04-06 DIAGNOSIS — R197 Diarrhea, unspecified: Secondary | ICD-10-CM

## 2024-04-06 DIAGNOSIS — K580 Irritable bowel syndrome with diarrhea: Secondary | ICD-10-CM

## 2024-04-06 DIAGNOSIS — K625 Hemorrhage of anus and rectum: Secondary | ICD-10-CM | POA: Diagnosis not present

## 2024-04-06 MED ORDER — SODIUM CHLORIDE 0.9 % IV SOLN: 500.0000 mL | Freq: Once | INTRAVENOUS | Status: DC

## 2024-04-06 NOTE — Progress Notes (Signed)
 Called to room to assist during endoscopic procedure.  Patient ID and intended procedure confirmed with present staff. Received instructions for my participation in the procedure from the performing physician.

## 2024-04-06 NOTE — Progress Notes (Signed)
 Pt's states no medical or surgical changes since previsit or office visit.

## 2024-04-06 NOTE — Op Note (Signed)
 [facility] Patient Name: Jimmy Benjamin Procedure Date: 04/06/2024 3:53 PM MRN: [id number] Endoscopist: Lajuan Pila , MD, [id number] Age: 28 Referring MD:  Date of Birth: [birth year] Gender: Male Account #: [id number] Procedure:                Colonoscopy Indications:              Clinically significant diarrhea of unexplained                            origin, Rectal bleeding Medicines:                Monitored Anesthesia Care Procedure:                Pre-Anesthesia Assessment:                           - Prior to the procedure, a History and Physical                            was performed, and patient medications and                            allergies were reviewed. The patient's tolerance of                            previous anesthesia was also reviewed. The risks                            and benefits of the procedure and the sedation                            options and risks were discussed with the patient.                            All questions were answered, and informed consent                            was obtained. Prior Anticoagulants: The patient has                            taken no anticoagulant or antiplatelet agents. ASA                            Grade Assessment: I - A normal, healthy patient.                            After reviewing the risks and benefits, the patient                            was deemed in satisfactory condition to undergo the                            procedure.  After obtaining informed consent, the colonoscope                            was passed under direct vision. Throughout the                            procedure, the patient's blood pressure, pulse, and                            oxygen saturations were monitored continuously. The                            [id number]-[id number] Slim SN [id number] was introduced through                            the anus and advanced to the 2 cm into the ileum.                             The colonoscopy was performed without difficulty.                            The patient tolerated the procedure well. The                            quality of the bowel preparation was good. The                            terminal ileum, ileocecal valve, appendiceal                            orifice, and rectum were photographed. Scope In: 4:05:46 PM Scope Out: 4:24:37 PM Scope Withdrawal Time: 0 hours 15 minutes 4 seconds  Total Procedure Duration: 0 hours 18 minutes 51 seconds  Findings:                 The colon (entire examined portion) appeared                            normal. Biopsies for histology were taken with a                            cold forceps from the entire colon for evaluation                            of microscopic colitis.                           Non-bleeding internal hemorrhoids were found during                            retroflexion. The hemorrhoids were small and Grade                            I (internal hemorrhoids that do not prolapse).  The terminal ileum appeared normal. Biopsies were                            taken with a cold forceps for histology.                           The exam was otherwise without abnormality on                            direct and retroflexion views. Complications:            No immediate complications. Estimated Blood Loss:     Estimated blood loss: none. Impression:               - The entire examined colon is normal. Biopsied.                           - Minimal internal hemorrhoids.                           - The examined portion of the ileum was normal.                            Biopsied.                           - The examination was otherwise normal on direct                            and retroflexion views. Recommendation:           - Patient has a contact number available for                            emergencies. The signs and symptoms of potential                             delayed complications were discussed with the                            patient. Return to normal activities tomorrow.                            Written discharge instructions were provided to the                            patient.                           - Resume previous diet.                           - Continue present medications.                           - Await pathology results.                           -  Repeat colonoscopy at age 66 as per current                            recommendations. Earlier, if any new problems or                            change in family history.                           - The findings and recommendations were discussed                            with the patient's family. Lajuan Pila, MD 04/06/2024 4:32:39 PM This report has been signed electronically.

## 2024-04-06 NOTE — Patient Instructions (Signed)
 Resume previous diet and medications. Repeat Colonoscopy at age 28 for screening purposes.  Awaiting pathology results.  YOU HAD AN ENDOSCOPIC PROCEDURE TODAY AT THE [facility]:   Refer to the procedure report that was given to you for any specific questions about what was found during the examination.  If the procedure report does not answer your questions, please call your gastroenterologist to clarify.  If you requested that your care partner not be given the details of your procedure findings, then the procedure report has been included in a sealed envelope for you to review at your convenience later.  YOU SHOULD EXPECT: Some feelings of bloating in the abdomen. Passage of more gas than usual.  Walking can help get rid of the air that was put into your GI tract during the procedure and reduce the bloating. If you had a lower endoscopy (such as a colonoscopy or flexible sigmoidoscopy) you may notice spotting of blood in your stool or on the toilet paper. If you underwent a bowel prep for your procedure, you may not have a normal bowel movement for a few days.  Please Note:  You might notice some irritation and congestion in your nose or some drainage.  This is from the oxygen used during your procedure.  There is no need for concern and it should clear up in a day or so.  SYMPTOMS TO REPORT IMMEDIATELY:  Following lower endoscopy (colonoscopy or flexible sigmoidoscopy):  Excessive amounts of blood in the stool  Significant tenderness or worsening of abdominal pains  Swelling of the abdomen that is new, acute  Fever of 100F or higher   For urgent or emergent issues, a gastroenterologist can be reached at any hour by calling [phone]. Do not use MyChart messaging for urgent concerns.    DIET:  We do recommend a small meal at first, but then you may proceed to your regular diet.  Drink plenty of fluids but you should avoid alcoholic beverages for 24 hours.  ACTIVITY:  You  should plan to take it easy for the rest of today and you should NOT DRIVE or use heavy machinery until tomorrow (because of the sedation medicines used during the test).    FOLLOW UP: Our staff will call the number listed on your records the next business day following your procedure.  We will call around 7:15- 8:00 am to check on you and address any questions or concerns that you may have regarding the information given to you following your procedure. If we do not reach you, we will leave a message.     If any biopsies were taken you will be contacted by phone or by letter within the next 1-3 weeks.  Please call us  at [phone] if you have not heard about the biopsies in 3 weeks.    SIGNATURES/CONFIDENTIALITY: You and/or your care partner have signed paperwork which will be entered into your electronic medical record.  These signatures attest to the fact that that the information above on your After Visit Summary has been reviewed and is understood.  Full responsibility of the confidentiality of this discharge information lies with you and/or your care-partner.

## 2024-04-06 NOTE — Progress Notes (Signed)
 Pt resting comfortably. VSS. Airway intact. SBAR complete to RN. All questions answered.

## 2024-04-07 ENCOUNTER — Telehealth: Payer: Self-pay

## 2024-04-07 NOTE — Telephone Encounter (Signed)
 Returned pt's call. States he had more discomfort last night with continued watery BMs after colonoscopy. Reports that today he had more of a formed stool and had less discomfort. He has already started using Preparation H cream and it has helped some. Instructed pt to continue using the cream and to eat a bland diet for another day or so and the hemorrhoid discomfort should improve. If it does not or if he develops large amounts of blood in his stool he was instructed to call back. Pt verbalized understanding.

## 2024-04-07 NOTE — Telephone Encounter (Signed)
 Agree with above  thanks a lot RG

## 2024-04-07 NOTE — Telephone Encounter (Signed)
 Patient called and stated that he was still passing some of his liquid prep from yesterday and it seems to have aggravated his hemorrhoids and was wondering what he can do in order to get that discomfort back to normal. Patient is requesting a call back. Please advise.

## 2024-04-07 NOTE — Telephone Encounter (Signed)
 Left message on follow up call.

## 2024-04-09 LAB — SURGICAL PATHOLOGY

## 2024-04-12 ENCOUNTER — Encounter: Payer: Self-pay | Admitting: Gastroenterology

## 2024-04-14 ENCOUNTER — Encounter: Payer: Self-pay | Admitting: Professional

## 2024-04-14 ENCOUNTER — Ambulatory Visit (INDEPENDENT_AMBULATORY_CARE_PROVIDER_SITE_OTHER): Admitting: Professional

## 2024-04-14 DIAGNOSIS — F339 Major depressive disorder, recurrent, unspecified: Secondary | ICD-10-CM

## 2024-04-14 DIAGNOSIS — F902 Attention-deficit hyperactivity disorder, combined type: Secondary | ICD-10-CM

## 2024-04-14 DIAGNOSIS — F324 Major depressive disorder, single episode, in partial remission: Secondary | ICD-10-CM

## 2024-04-14 NOTE — Progress Notes (Signed)
  Behavioral Health Counselor/Therapist Progress Note  Patient ID: Jimmy Benjamin, MRN: [id number],    Date: 04/14/2024  Time Spent: 44 minutes 1102-1146am   Treatment Type: Individual Therapy  Risk Assessment: Danger to Self:  No Self-injurious Behavior: No Danger to Others: No  Subjective: This session was held via video teletherapy. The patient consented to video teletherapy and was located in his home during this session. He is aware it is the responsibility of the patient to secure confidentiality on his end of the session. The provider was in a private home office for the duration of this session.    The patient arrived on time for his Caregility appointment.  Issues addressed: 1-anxiety a-has improved 2-professional -has figure out what he wants to do with his life -he wants to go back to school, "it's what I need to do" -money has been a barrier but he  -he plans to apply to [employer] in [place] -plans to study radiography -cheaper cost of living in [place] and gets him away -plans to get loans deferred -he discussed with his sister and she is supportive -he thinks he will have a hard time for his parents to be supportive -he thinks his mother will be more difficult -he feels nervous but is pushing through 3-relationship issues -he ended the relationship with his gf -it went well and was amicable -he feels good about the breakup and admits it was the right choice -he has realized that people are not out to get him -he has determined it is how he perceives  Treatment Plan Problems: Depression, Family Conflict, Vocational Stress Symptoms: Ongoing conflict with parents, which is characterized by parents fostering dependence leading to feelings that the parents are overly involved. Maintains a residence with parents and has been unable to live independently for more than a brief period. Constant or frequent conflict with parents  and/or siblings. A family that is not a stable source of positive influence or support, since family members have little or no contact with each other. Depressed or irritable mood. Diminished interest in or enjoyment of activities. Social withdrawal. History of chronic or recurrent depression for which the client has taken antidepressant medication, been hospitalized, had outpatient treatment, or had a course of electroconvulsive therapy. Feelings of depression and anxiety related to complaints of job dissatisfaction or the stress of employment responsibilities. Goals: Reach a level of reduced tension, increased satisfaction, and improved communication with family and/or other authority figures. Decrease the level of present conflict with parents while beginning to let go of or resolving past conflicts with them. Develop healthy interpersonal relationships that lead to the alleviation and help prevent the relapse of depression. Pursue employment consistency with a reasonably hopeful and positive attitude. Increase job satisfaction and performance due to implementation of assertiveness and stress management strategies. Objectives target date for all objectives is 10/17/2024: Identify own as well as others' role in the family conflicts. Older children and teens learn skills for managing anger and solving problems without conflict. Report an increase in resolving conflicts with parents by talking calmly and assertively rather than aggressively and defensively. Learn and implement conflict resolution skills to resolve interpersonal problems. Increasingly verbalize hopeful and positive statements regarding self, others, and the future. Outline plan for job search. Report on job search experiences and feelings surrounding these experiences. Interventions: Use modeling, role-playing, and behavioral rehearsal to teach the client anger control techniques that include stop, think, and act as well as  cognitive  problem-solving skills; role-play the application of the skills to multiple situations in the client's life. Use role-playing, role reversal, modeling, and behavioral rehearsal to help the client develop assertive ways to resolve conflict with parents (recommend Your Perfect Right: Assertiveness and Equality in Your Life and Relationships by Angelo Barge). Confront the client when he/she is not taking responsibility for his/her role in the family conflict and reinforce the client for owning responsibility for his/her contribution to the conflict. Help the client resolve depression related to interpersonal problems through the use of reassurance and support, clarification of cognitive and affective triggers that ignite conflicts, and active problem-solving (or assign "Applying Problem-Solving to Interpersonal Conflict" in the Adult Psychotherapy Homework Planner by Beacher Bottoms). Assign the client to write at least one positive affirmation statement daily regarding himself/herself and the future (or assign "Positive Self-Talk" in the Adult Psychotherapy Homework Planner by Beacher Bottoms). Help the client develop a written job plan that contains specific attainable objectives for job search (recommend What Color Is Your Parachute?: A Practical Manual for Job-Hunters and Career-Changers by [employer]). Monitor, encourage, and process the client's search for employment.  Diagnosis: Major depressive disorder, recurrent, mild  Plan:  -meet again on Wednesday, May 13, 2024 at 8am

## 2024-05-13 ENCOUNTER — Ambulatory Visit (INDEPENDENT_AMBULATORY_CARE_PROVIDER_SITE_OTHER): Admitting: Professional

## 2024-05-13 ENCOUNTER — Encounter: Payer: Self-pay | Admitting: Professional

## 2024-05-13 DIAGNOSIS — F324 Major depressive disorder, single episode, in partial remission: Secondary | ICD-10-CM

## 2024-05-13 DIAGNOSIS — F902 Attention-deficit hyperactivity disorder, combined type: Secondary | ICD-10-CM

## 2024-05-13 DIAGNOSIS — F330 Major depressive disorder, recurrent, mild: Secondary | ICD-10-CM

## 2024-05-13 NOTE — Progress Notes (Signed)
 [facility] Counselor/Therapist Progress Note  Patient ID: Eleftherios Dudenhoeffer, MRN: [id number],    Date: 05/13/2024  Time Spent: 32 minutes 8-832am   Treatment Type: Individual Therapy  Risk Assessment: Danger to Self:  No Self-injurious Behavior: No Danger to Others: No  Subjective: This session was held via video teletherapy. The patient consented to video teletherapy and was located in his home during this session. He is aware it is the responsibility of the patient to secure confidentiality on his end of the session. The provider was in a private home office for the duration of this session.    The patient arrived on time for his Caregility appointment.  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 3-personal a-looking to move to [place] to attend school -his sister broke the ice with his parents about his return to school -mother was not supportive of him moving and he told her that it was okay but that he was not asking her permission b-he asked his mother to support him emotionally 4-relationships a-he has started dating and was  young woman though was not looking to do so -she responded to an Instagram post and admitted she has had a crush on him for five years -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 b- pt reports she she now displays an anxious secure attachment -she is emotionally available -in the past she was insecure avoidant c-he drove down and they  went on a date -they started dating about 4 weeks ago and he has met her mother and her friends d-he plans to live with her to begin and has talked with his sister and close friends -he plans to live with her for two months while saving money to get his own place -she only pays $59 and her male roommate is moving out and he will move in -she works from 6p-12a doing Clinical biochemist for [employer]  Treatment Plan Problems: Depression, Family Conflict, Vocational Stress Symptoms: Ongoing conflict with parents, which is characterized by parents fostering dependence leading to feelings that the parents are overly involved. Maintains a residence with parents and has been unable to live independently for more than a brief period. Constant or frequent conflict with parents and/or siblings. A family that is not a stable source of positive influence or support, since family members have little or no contact with each other. Depressed or irritable mood. Diminished interest in or enjoyment of activities. Social withdrawal. History of chronic or recurrent depression for which the client has taken antidepressant medication, been hospitalized, had outpatient treatment, or had a course of electroconvulsive therapy. Feelings of depression and anxiety related to complaints of job dissatisfaction or the stress of employment responsibilities. Goals: Reach a level of reduced tension, increased satisfaction, and improved communication with family and/or other authority figures. Decrease the level of present conflict with parents while beginning to let go of or resolving past conflicts with them. Develop healthy interpersonal relationships that lead to the alleviation and help prevent the relapse of depression. Pursue employment consistency with a  reasonably hopeful and positive attitude. Increase job satisfaction and performance due to implementation of assertiveness and stress management  strategies. Objectives target date for all objectives is 10/17/2024: Identify own as well as others' role in the family conflicts. Older children and teens learn skills for managing anger and solving problems without conflict. Report an increase in resolving conflicts with parents by talking calmly and assertively rather than aggressively and defensively. Learn and implement conflict resolution skills to resolve interpersonal problems. Increasingly verbalize hopeful and positive statements regarding self, others, and the future. Outline plan for job search. Report on job search experiences and feelings surrounding these experiences. Interventions: Use modeling, role-playing, and behavioral rehearsal to teach the client anger control techniques that include stop, think, and act as well as cognitive problem-solving skills; role-play the application of the skills to multiple situations in the client's life. Use role-playing, role reversal, modeling, and behavioral rehearsal to help the client develop assertive ways to resolve conflict with parents (recommend Your Perfect Right: Assertiveness and Equality in Your Life and Relationships by Angelo Barge). Confront the client when he/she is not taking responsibility for his/her role in the family conflict and reinforce the client for owning responsibility for his/her contribution to the conflict. Help the client resolve depression related to interpersonal problems through the use of reassurance and support, clarification of cognitive and affective triggers that ignite conflicts, and active problem-solving (or assign "Applying Problem-Solving to Interpersonal Conflict" in the Adult Psychotherapy Homework Planner by Beacher Bottoms). Assign the client to write at least one positive affirmation statement daily regarding himself/herself and the future (or assign "Positive Self-Talk" in the Adult Psychotherapy Homework Planner by Beacher Bottoms). Help the client develop a  written job plan that contains specific attainable objectives for job search (recommend What Color Is Your Parachute?: A Practical Manual for Job-Hunters and Career-Changers by [employer]). Monitor, encourage, and process the client's search for employment.  Diagnosis: Major depressive disorder, recurrent, mild  Plan:  -meet again on Tuesday, June 02, 2024 at 10am

## 2024-06-02 ENCOUNTER — Ambulatory Visit (INDEPENDENT_AMBULATORY_CARE_PROVIDER_SITE_OTHER): Admitting: Professional

## 2024-06-02 ENCOUNTER — Encounter: Payer: Self-pay | Admitting: Professional

## 2024-06-02 DIAGNOSIS — F324 Major depressive disorder, single episode, in partial remission: Secondary | ICD-10-CM

## 2024-06-02 DIAGNOSIS — F902 Attention-deficit hyperactivity disorder, combined type: Secondary | ICD-10-CM

## 2024-06-02 NOTE — Progress Notes (Signed)
 [facility] Counselor/Therapist Progress Note  Patient ID: Jimmy Benjamin, MRN: [id number],    Date: 06/02/2024  Time Spent: 9 minutes 1006-1015am   Treatment Type: Individual Therapy  Risk Assessment: Danger to Self:  No Self-injurious Behavior: No Danger to Others: No  Subjective: This session was held via video teletherapy. The patient consented to video teletherapy and was located in his work truck during this session. He is aware it is the responsibility of the patient to secure confidentiality on his end of the session. The provider was in a private home office for the duration of this session.    The patient arrived on time for his Caregility appointment.  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 Maintains a residence with parents and has been unable to live independently for more than a brief period. Constant or frequent conflict with parents and/or siblings. A family that is not a stable source of positive influence or support, since family members have little or no contact with each other. Depressed or irritable mood. Diminished interest in or enjoyment of  activities. Social withdrawal. History of chronic or recurrent depression for which the client has taken antidepressant medication, been hospitalized, had outpatient treatment, or had a course of electroconvulsive therapy. Feelings of depression and anxiety related to complaints of job dissatisfaction or the stress of employment responsibilities. Goals: Reach a level of reduced tension, increased satisfaction, and improved communication with family and/or other authority figures. Decrease the level of present conflict with parents while beginning to let go of or resolving past conflicts with them. Develop healthy interpersonal relationships that lead to the alleviation and help prevent the relapse of depression. Pursue employment consistency with a reasonably hopeful and positive attitude. Increase job satisfaction and performance due to implementation of assertiveness and stress management strategies. Objectives target date for all objectives is 10/17/2024: Identify own as well as others' role in the family conflicts. Older children and teens learn skills for managing anger and solving problems without conflict. Report an increase in resolving conflicts with parents by talking calmly and assertively rather than aggressively and defensively. Learn and implement conflict resolution skills to resolve interpersonal problems. Increasingly verbalize hopeful and positive statements regarding self, others, and the future. Outline plan for job search. Report on job search experiences and feelings surrounding these experiences. Interventions: Use modeling, role-playing, and behavioral rehearsal to teach the client anger control techniques that include stop, think, and act as well as cognitive problem-solving skills; role-play the application of the skills to multiple situations in the client's life. Use role-playing, role reversal, modeling, and behavioral rehearsal to help the client develop assertive  ways to resolve conflict with parents (recommend Your Perfect Right: Assertiveness and Equality in Your Life and Relationships by Darrelyn armin Sick). Confront the client  when he/she is not taking responsibility for his/her role in the family conflict and reinforce the client for owning responsibility for his/her contribution to the conflict. Help the client resolve depression related to interpersonal problems through the use of reassurance and support, clarification of cognitive and affective triggers that ignite conflicts, and active problem-solving (or assign Applying Problem-Solving to Interpersonal Conflict in the Adult Psychotherapy Homework Planner by [employer]). Assign the client to write at least one positive affirmation statement daily regarding himself/herself and the future (or assign Positive Self-Talk in the Adult Psychotherapy Homework Planner by Jenniffer). Help the client develop a written job plan that contains specific attainable objectives for job search (recommend What Color Is Your Parachute?: A Practical Manual for Job-Hunters and Career-Changers by [employer]). Monitor, encourage, and process the client's search for employment.  Diagnosis: Major depressive disorder, recurrent, mild  Plan:  -pt would like for this to be our last session -pt made aware of how to access services if interested in future -pt cancelled Thursday, June 25, 2024 at 10am

## 2024-06-25 ENCOUNTER — Ambulatory Visit: Admitting: Professional
